# Patient Record
Sex: Male | Born: 1980 | Race: Black or African American | Hispanic: No | Marital: Single | Smoking: Current every day smoker
Health system: Southern US, Community
[De-identification: ages and names within clinical notes are randomized; demographics above are authoritative.]

## PROBLEM LIST (undated history)

## (undated) DIAGNOSIS — J189 Pneumonia, unspecified organism: Secondary | ICD-10-CM

---

## 2010-08-10 ENCOUNTER — Emergency Department (HOSPITAL_COMMUNITY)
Admission: EM | Admit: 2010-08-10 | Discharge: 2010-08-10 | Disposition: A | Discharge status: Home / Self Care | Payer: No Typology Code available for payment source | Attending: Emergency Medicine | Admitting: Emergency Medicine

## 2010-08-10 ENCOUNTER — Emergency Department (HOSPITAL_COMMUNITY): Payer: Self-pay

## 2010-08-10 DIAGNOSIS — S139XXA Sprain of joints and ligaments of unspecified parts of neck, initial encounter: Secondary | ICD-10-CM | POA: Insufficient documentation

## 2010-08-10 DIAGNOSIS — M542 Cervicalgia: Secondary | ICD-10-CM | POA: Insufficient documentation

## 2010-08-10 DIAGNOSIS — R51 Headache: Secondary | ICD-10-CM | POA: Insufficient documentation

## 2010-08-10 DIAGNOSIS — IMO0002 Reserved for concepts with insufficient information to code with codable children: Secondary | ICD-10-CM | POA: Insufficient documentation

## 2010-08-10 DIAGNOSIS — S0100XA Unspecified open wound of scalp, initial encounter: Secondary | ICD-10-CM | POA: Insufficient documentation

## 2010-08-10 DIAGNOSIS — E876 Hypokalemia: Secondary | ICD-10-CM | POA: Insufficient documentation

## 2010-08-10 DIAGNOSIS — M549 Dorsalgia, unspecified: Secondary | ICD-10-CM | POA: Insufficient documentation

## 2010-08-10 DIAGNOSIS — R071 Chest pain on breathing: Secondary | ICD-10-CM | POA: Insufficient documentation

## 2010-08-10 LAB — POCT I-STAT, CHEM 8
BUN: 8 mg/dL (ref 6–23)
Chloride: 103 mEq/L (ref 96–112)
Potassium: 3.3 mEq/L — ABNORMAL LOW (ref 3.5–5.1)
Sodium: 139 mEq/L (ref 135–145)

## 2010-08-10 LAB — ETHANOL: Alcohol, Ethyl (B): <11 mg/dL — ABNORMAL HIGH (ref 0–10)

## 2010-08-15 ENCOUNTER — Emergency Department (HOSPITAL_COMMUNITY): Payer: No Typology Code available for payment source

## 2010-08-15 ENCOUNTER — Emergency Department (HOSPITAL_COMMUNITY)
Admission: EM | Admit: 2010-08-15 | Discharge: 2010-08-15 | Disposition: A | Discharge status: Home / Self Care | Payer: No Typology Code available for payment source | Attending: Emergency Medicine | Admitting: Emergency Medicine

## 2010-08-15 DIAGNOSIS — Z4802 Encounter for removal of sutures: Secondary | ICD-10-CM | POA: Insufficient documentation

## 2010-08-15 DIAGNOSIS — M25519 Pain in unspecified shoulder: Secondary | ICD-10-CM | POA: Insufficient documentation

## 2010-09-18 ENCOUNTER — Emergency Department (HOSPITAL_COMMUNITY)
Admission: EM | Admit: 2010-09-18 | Discharge: 2010-09-18 | Disposition: A | Discharge status: Home / Self Care | Payer: Medicaid Other | Attending: Emergency Medicine | Admitting: Emergency Medicine

## 2010-09-18 DIAGNOSIS — S335XXA Sprain of ligaments of lumbar spine, initial encounter: Secondary | ICD-10-CM | POA: Insufficient documentation

## 2010-09-18 DIAGNOSIS — M545 Low back pain, unspecified: Secondary | ICD-10-CM | POA: Insufficient documentation

## 2010-09-18 DIAGNOSIS — X503XXA Overexertion from repetitive movements, initial encounter: Secondary | ICD-10-CM | POA: Insufficient documentation

## 2011-08-31 ENCOUNTER — Emergency Department (HOSPITAL_COMMUNITY)
Admission: EM | Admit: 2011-08-31 | Discharge: 2011-08-31 | Disposition: A | Discharge status: Home / Self Care | Payer: Self-pay | Attending: Emergency Medicine | Admitting: Emergency Medicine

## 2011-08-31 ENCOUNTER — Encounter (HOSPITAL_COMMUNITY): Payer: Self-pay | Admitting: *Deleted

## 2011-08-31 DIAGNOSIS — H00013 Hordeolum externum right eye, unspecified eyelid: Secondary | ICD-10-CM

## 2011-08-31 DIAGNOSIS — F17200 Nicotine dependence, unspecified, uncomplicated: Secondary | ICD-10-CM | POA: Insufficient documentation

## 2011-08-31 DIAGNOSIS — H5710 Ocular pain, unspecified eye: Secondary | ICD-10-CM | POA: Insufficient documentation

## 2011-08-31 HISTORY — DX: Pneumonia, unspecified organism: J18.9

## 2011-08-31 MED ORDER — ERYTHROMYCIN 5 MG/GM OP OINT: TOPICAL_OINTMENT | OPHTHALMIC | Status: DC

## 2011-08-31 NOTE — ED Provider Notes (Signed)
Medical screening examination/treatment/procedure(s) were performed by non-physician practitioner and as supervising physician I was immediately available for consultation/collaboration.   Eriverto Byrnes M Kayann Maj, MD 08/31/11 2349 

## 2011-08-31 NOTE — ED Provider Notes (Signed)
History     CSN: [id number]  Arrival date & time 08/31/11  0919   First MD Initiated Contact with Patient 08/31/11 [phone]      Chief Complaint  Patient presents with  . Eye Pain    right    (Consider location/radiation/quality/duration/timing/severity/associated sxs/prior treatment) HPI Patient presents emergency Dept. with pain and swelling to his right upper eyelid.  He states that he noticed it 2 days ago.  Patient denies any treatment for the swelling or pain.  Patient states that he does not have any visual changes, fever, headache, weakness, or nausea/vomiting.  Patient states a palpation makes the pain worse. Past Medical History  Diagnosis Date  . Pneumonia     History reviewed. No pertinent past surgical history.  History reviewed. No pertinent family history.  History  Substance Use Topics  . Smoking status: Current Everyday Smoker  . Smokeless tobacco: Not on file  . Alcohol Use: No      Review of Systems   All other systems negative except as documented in the HPI. All pertinent positives and negatives as reviewed in the HPI.  Allergies  Review of patient's allergies indicates no known allergies.  Home Medications  No current outpatient prescriptions on file.  BP 117/79  Pulse 73  Temp(Src) 98.2 F (36.8 C) (Oral)  Resp 18  SpO2 100%  Physical Exam  Nursing note and vitals reviewed. Constitutional: He appears well-developed and well-nourished.  Eyes:       Patient has a stye to his right upper lid and swelling noted.  There is no sign of periorbital infection at this time.  Pupils are PERRLA  Cardiovascular: Normal rate and regular rhythm.   Skin: Skin is warm and dry. No rash noted.    ED Course  Procedures (including critical care time)  Patiently for referred ophthalmology and given treatment for a right upper lid sty.  He is told to return here for any worsening in his condition  MDM          Carlyle Dolly, PA-C 08/31/11  1105

## 2011-08-31 NOTE — ED Notes (Signed)
Pt is here with right eyelid swelling and itching.  No vision change

## 2011-08-31 NOTE — Discharge Instructions (Signed)
Follow up with the eye doctor provided. REturn here as needed. Use as many warm compresses on the area as possible.

## 2012-02-17 ENCOUNTER — Emergency Department (HOSPITAL_COMMUNITY)
Admission: EM | Admit: 2012-02-17 | Discharge: 2012-02-17 | Disposition: A | Discharge status: Home / Self Care | Payer: Self-pay | Attending: Emergency Medicine | Admitting: Emergency Medicine

## 2012-02-17 ENCOUNTER — Encounter (HOSPITAL_COMMUNITY): Payer: Self-pay | Admitting: Physical Medicine and Rehabilitation

## 2012-02-17 DIAGNOSIS — Z792 Long term (current) use of antibiotics: Secondary | ICD-10-CM | POA: Insufficient documentation

## 2012-02-17 DIAGNOSIS — F17200 Nicotine dependence, unspecified, uncomplicated: Secondary | ICD-10-CM | POA: Insufficient documentation

## 2012-02-17 DIAGNOSIS — M549 Dorsalgia, unspecified: Secondary | ICD-10-CM | POA: Insufficient documentation

## 2012-02-17 DIAGNOSIS — J189 Pneumonia, unspecified organism: Secondary | ICD-10-CM | POA: Insufficient documentation

## 2012-02-17 MED ORDER — OXYCODONE-ACETAMINOPHEN 5-325 MG PO TABS: 2.0000 | ORAL_TABLET | ORAL | Status: DC | PRN

## 2012-02-17 MED ORDER — OXYCODONE-ACETAMINOPHEN 5-325 MG PO TABS
1.0000 | ORAL_TABLET | Freq: Once | ORAL | Status: AC
  Administered 2012-02-17: 1 via ORAL
  Filled 2012-02-17: qty 1

## 2012-02-17 NOTE — ED Notes (Signed)
Patient is alert and orientedx4.  Patient was explained discharge instructions and he had no questions.  A friend is coming to transport him home.
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
## 2012-02-17 NOTE — ED Notes (Signed)
Patient says he hurt himself at work yesterday and he could not come to the hospital until today because of transportation.  Patient says his back has been hurting him severely since yesterday. Finally today someone could give him a ride to the ED.

## 2012-02-24 ENCOUNTER — Emergency Department (HOSPITAL_COMMUNITY)
Admission: EM | Admit: 2012-02-24 | Discharge: 2012-02-24 | Disposition: A | Discharge status: Home / Self Care | Payer: Self-pay | Attending: Emergency Medicine | Admitting: Emergency Medicine

## 2012-02-24 DIAGNOSIS — Z09 Encounter for follow-up examination after completed treatment for conditions other than malignant neoplasm: Secondary | ICD-10-CM | POA: Insufficient documentation

## 2012-02-24 DIAGNOSIS — F17200 Nicotine dependence, unspecified, uncomplicated: Secondary | ICD-10-CM | POA: Insufficient documentation

## 2012-02-24 DIAGNOSIS — M549 Dorsalgia, unspecified: Secondary | ICD-10-CM

## 2012-02-24 NOTE — ED Provider Notes (Signed)
History     CSN: [id number]  Arrival date & time 02/24/12  1313   First MD Initiated Contact with Patient 02/24/12 1401      Chief Complaint  Patient presents with  . Follow-up   Chief complaint: Needs work note (Consider location/radiation/quality/duration/timing/severity/associated sxs/prior treatment) HPI Patient seen here 02/17/2012 for low back pain needs a note allowing him to go back to work. He is asymptomatic today. No other complaint. Treated with Percocet. Presently not requiring any medications. Past Medical History  Diagnosis Date  . Pneumonia     No past surgical history on file.  No family history on file.  History  Substance Use Topics  . Smoking status: Current Every Day Smoker    Types: Cigarettes  . Smokeless tobacco: Not on file  . Alcohol Use: No      Review of Systems  Constitutional: Negative.   HENT: Negative.   Respiratory: Negative.   Cardiovascular: Negative.   Gastrointestinal: Negative.   Musculoskeletal: Negative.   Skin: Negative.   Neurological: Negative.   Hematological: Negative.   Psychiatric/Behavioral: Negative.   All other systems reviewed and are negative.    Allergies  Review of patient's allergies indicates no known allergies.  Home Medications  No current outpatient prescriptions on file.  BP 118/64  Pulse 82  Temp 97.9 F (36.6 C) (Oral)  Resp 16  SpO2 97%  Physical Exam  Nursing note and vitals reviewed. Constitutional: He is oriented to person, place, and time. He appears well-developed and well-nourished.  HENT:  Head: Normocephalic and atraumatic.  Eyes: Conjunctivae normal are normal. Pupils are equal, round, and reactive to light.  Neck: Neck supple. No tracheal deviation present. No thyromegaly present.  Cardiovascular: Normal rate and regular rhythm.   No murmur heard. Pulmonary/Chest: Effort normal and breath sounds normal.  Abdominal: Soft. Bowel sounds are normal. He exhibits no  distension. There is no tenderness.  Musculoskeletal: Normal range of motion. He exhibits no edema and no tenderness.       Entire spine nontender  Neurological: He is alert and oriented to person, place, and time. Coordination normal.       Gait normal motor strength 5 over 5 overall  Skin: Skin is warm and dry. No rash noted.  Psychiatric: He has a normal mood and affect.    ED Course  Procedures (including critical care time)  Labs Reviewed - No data to display No results found.   No diagnosis found.    MDM  Patient cleared to return to work Diagnosis back Marisue Ivan, MD 02/24/12 1411

## 2012-02-24 NOTE — ED Notes (Signed)
Returned to ED for clearance to return to work after a back injury. States he needs a note that says he can return without restrictions

## 2012-02-24 NOTE — ED Notes (Signed)
Patient was seen here in the ER last week for back pain. He states he feels better and is unable to return to work with out a follow up and paperwork lifting his restrictions. He states he is pain free at this time.

## 2012-03-19 ENCOUNTER — Encounter (HOSPITAL_COMMUNITY): Payer: Self-pay | Admitting: Family Medicine

## 2012-03-19 ENCOUNTER — Emergency Department (HOSPITAL_COMMUNITY)
Admission: EM | Admit: 2012-03-19 | Discharge: 2012-03-19 | Disposition: A | Discharge status: Home / Self Care | Payer: Self-pay | Attending: Emergency Medicine | Admitting: Emergency Medicine

## 2012-03-19 DIAGNOSIS — R1110 Vomiting, unspecified: Secondary | ICD-10-CM | POA: Insufficient documentation

## 2012-03-19 DIAGNOSIS — F17200 Nicotine dependence, unspecified, uncomplicated: Secondary | ICD-10-CM | POA: Insufficient documentation

## 2012-03-19 DIAGNOSIS — Z8701 Personal history of pneumonia (recurrent): Secondary | ICD-10-CM | POA: Insufficient documentation

## 2012-03-19 LAB — COMPREHENSIVE METABOLIC PANEL
AST: 20 U/L (ref 0–37)
Alkaline Phosphatase: 60 U/L (ref 39–117)
BUN: 9 mg/dL (ref 6–23)
CO2: 26 mEq/L (ref 19–32)
Chloride: 103 mEq/L (ref 96–112)
Creatinine, Ser: 0.87 mg/dL (ref 0.50–1.35)
GFR calc non Af Amer: >90 mL/min (ref >90)
Potassium: 4.4 mEq/L (ref 3.5–5.1)
Total Bilirubin: 0.4 mg/dL (ref 0.3–1.2)

## 2012-03-19 LAB — CBC WITH DIFFERENTIAL/PLATELET
Basophils Absolute: 0.1 10*3/uL (ref 0.0–0.1)
HCT: 42.8 % (ref 39.0–52.0)
Hemoglobin: 15.3 g/dL (ref 13.0–17.0)
Lymphocytes Relative: 34 % (ref 12–46)
Monocytes Absolute: 0.3 10*3/uL (ref 0.1–1.0)
Monocytes Relative: 5 % (ref 3–12)
Neutro Abs: 2.6 10*3/uL (ref 1.7–7.7)
RBC: 4.86 MIL/uL (ref 4.22–5.81)
WBC: 5 10*3/uL (ref 4.0–10.5)

## 2012-03-19 LAB — URINALYSIS, ROUTINE W REFLEX MICROSCOPIC
Bilirubin Urine: NEGATIVE
Glucose, UA: NEGATIVE mg/dL
Ketones, ur: NEGATIVE mg/dL
Leukocytes, UA: NEGATIVE
Protein, ur: NEGATIVE mg/dL

## 2012-03-19 LAB — URINE MICROSCOPIC-ADD ON

## 2012-03-19 MED ORDER — PROMETHAZINE HCL 25 MG PO TABS: 25.0000 mg | ORAL_TABLET | Freq: Four times a day (QID) | ORAL | Status: DC | PRN

## 2012-03-19 NOTE — ED Provider Notes (Signed)
History   This chart was scribed for Marcus Shi, MD, by Frederik Pear, ER scribe. The patient was seen in room TR05C/TR05C and the patient's care was started at 1120.    CSN: [id number]  Arrival date & time 03/19/12  4098   First MD Initiated Contact with Patient 03/19/12 1120      Chief Complaint  Patient presents with  . Emesis    (Consider location/radiation/quality/duration/timing/severity/associated sxs/prior treatment) HPI Comments: Marcus Dennis is a 31 y.o. male who presents to the Emergency Department complaining of intermittent emesis 3x that began at 0500 and awoke him from sleep. He denies any abdominal pain or diarrhea. In ED, his temperature is 99 and he denies nausea. He has no chronic medication conditions and takes no daily medications. He reports that he has from [place], but has not traveled out of the country recently. He states that he has not consumed ETOH within the past week.    Past Medical History  Diagnosis Date  . Pneumonia     History reviewed. No pertinent past surgical history.  History reviewed. No pertinent family history.  History  Substance Use Topics  . Smoking status: Current Every Day Smoker    Types: Cigarettes  . Smokeless tobacco: Not on file  . Alcohol Use: No      Review of Systems A complete 10 system review of systems was obtained and all systems are negative except as noted in the HPI and PMH.   Allergies  Review of patient's allergies indicates no known allergies.  Home Medications   Current Outpatient Rx  Name  Route  Sig  Dispense  Refill  . PROMETHAZINE HCL 25 MG PO TABS   Oral   Take 1 tablet (25 mg total) by mouth every 6 (six) hours as needed for nausea.   30 tablet   0     BP 122/73  Pulse 80  Temp 99 F (37.2 C) (Oral)  Resp 16  SpO2 97%  Physical Exam  Nursing note and vitals reviewed. Constitutional: He is oriented to person, place, and time. He appears well-developed and well-nourished.  No distress.  HENT:  Head: Normocephalic and atraumatic.  Eyes: Pupils are equal, round, and reactive to light.  Neck: Normal range of motion.  Cardiovascular: Normal rate and intact distal pulses.   Pulmonary/Chest: No respiratory distress.  Abdominal: Normal appearance. He exhibits no distension. There is no tenderness. There is no rebound and no guarding.  Musculoskeletal: Normal range of motion.  Neurological: He is alert and oriented to person, place, and time. No cranial nerve deficit.  Skin: Skin is warm and dry. No rash noted.  Psychiatric: He has a normal mood and affect. His behavior is normal.    ED Course  Procedures (including critical care time)  DIAGNOSTIC STUDIES: Oxygen Saturation is 97% on room air, adequate by my interpretation.    COORDINATION OF CARE:  11:49- Discussed planned course of treatment with the patient, including blood work, who is agreeable at this time.   Results for orders placed during the hospital encounter of 03/19/12  COMPREHENSIVE METABOLIC PANEL      Component Value Range   Sodium 139  135 - 145 mEq/L   Potassium 4.4  3.5 - 5.1 mEq/L   Chloride 103  96 - 112 mEq/L   CO2 26  19 - 32 mEq/L   Glucose, Bld 78  70 - 99 mg/dL   BUN 9  6 - 23 mg/dL   Creatinine, Ser  0.87  0.50 - 1.35 mg/dL   Calcium 9.5  8.4 - 16.1 mg/dL   Total Protein 7.8  6.0 - 8.3 g/dL   Albumin 3.8  3.5 - 5.2 g/dL   AST 20  0 - 37 U/L   ALT 12  0 - 53 U/L   Alkaline Phosphatase 60  39 - 117 U/L   Total Bilirubin 0.4  0.3 - 1.2 mg/dL   GFR calc non Af Amer >90  >90 mL/min   GFR calc Af Amer >90  >90 mL/min  LIPASE, BLOOD      Component Value Range   Lipase 15  11 - 59 U/L  URINALYSIS, ROUTINE W REFLEX MICROSCOPIC      Component Value Range   Color, Urine YELLOW  YELLOW   APPearance CLEAR  CLEAR   Specific Gravity, Urine 1.021  1.005 - 1.030   pH 5.0  5.0 - 8.0   Glucose, UA NEGATIVE  NEGATIVE mg/dL   Hgb urine dipstick TRACE (*) NEGATIVE   Bilirubin Urine  NEGATIVE  NEGATIVE   Ketones, ur NEGATIVE  NEGATIVE mg/dL   Protein, ur NEGATIVE  NEGATIVE mg/dL   Urobilinogen, UA 0.2  0.0 - 1.0 mg/dL   Nitrite NEGATIVE  NEGATIVE   Leukocytes, UA NEGATIVE  NEGATIVE  URINE MICROSCOPIC-ADD ON      Component Value Range   Squamous Epithelial / LPF RARE  RARE   WBC, UA 0-2  <3 WBC/hpf   RBC / HPF 0-2  <3 RBC/hpf     Labs Reviewed  URINALYSIS, ROUTINE W REFLEX MICROSCOPIC - Abnormal; Notable for the following:    Hgb urine dipstick TRACE (*)     All other components within normal limits  COMPREHENSIVE METABOLIC PANEL  LIPASE, BLOOD  URINE MICROSCOPIC-ADD ON  CBC WITH DIFFERENTIAL   No results found.   1. Vomiting       MDM  I personally performed the services described in this documentation, which was scribed in my presence. The recorded information has been reviewed and considered.      Marcus Shi, MD 03/19/12 [phone]
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
## 2013-12-26 ENCOUNTER — Emergency Department (HOSPITAL_COMMUNITY)
Admission: EM | Admit: 2013-12-26 | Discharge: 2013-12-26 | Disposition: A | Discharge status: Home / Self Care | Payer: Medicaid Other | Attending: Emergency Medicine | Admitting: Emergency Medicine

## 2013-12-26 ENCOUNTER — Emergency Department (HOSPITAL_COMMUNITY): Payer: Medicaid Other

## 2013-12-26 ENCOUNTER — Encounter (HOSPITAL_COMMUNITY): Payer: Self-pay | Admitting: Emergency Medicine

## 2013-12-26 DIAGNOSIS — Z8701 Personal history of pneumonia (recurrent): Secondary | ICD-10-CM | POA: Insufficient documentation

## 2013-12-26 DIAGNOSIS — F17200 Nicotine dependence, unspecified, uncomplicated: Secondary | ICD-10-CM | POA: Insufficient documentation

## 2013-12-26 DIAGNOSIS — R059 Cough, unspecified: Secondary | ICD-10-CM | POA: Insufficient documentation

## 2013-12-26 DIAGNOSIS — M546 Pain in thoracic spine: Secondary | ICD-10-CM | POA: Insufficient documentation

## 2013-12-26 DIAGNOSIS — R05 Cough: Secondary | ICD-10-CM | POA: Insufficient documentation

## 2013-12-26 DIAGNOSIS — J9801 Acute bronchospasm: Secondary | ICD-10-CM

## 2013-12-26 DIAGNOSIS — J069 Acute upper respiratory infection, unspecified: Secondary | ICD-10-CM | POA: Insufficient documentation

## 2013-12-26 DIAGNOSIS — B9789 Other viral agents as the cause of diseases classified elsewhere: Secondary | ICD-10-CM

## 2013-12-26 MED ORDER — ALBUTEROL SULFATE (2.5 MG/3ML) 0.083% IN NEBU
5.0000 mg | INHALATION_SOLUTION | Freq: Once | RESPIRATORY_TRACT | Status: AC
  Administered 2013-12-26: 5 mg via RESPIRATORY_TRACT
  Filled 2013-12-26: qty 6

## 2013-12-26 MED ORDER — ALBUTEROL SULFATE HFA 108 (90 BASE) MCG/ACT IN AERS
2.0000 | INHALATION_SPRAY | RESPIRATORY_TRACT | Status: DC | PRN
  Administered 2013-12-26: 2 via RESPIRATORY_TRACT
  Filled 2013-12-26: qty 6.7

## 2013-12-26 MED ORDER — FLUTICASONE PROPIONATE 50 MCG/ACT NA SUSP: 2.0000 | Freq: Every day | NASAL | Status: DC

## 2013-12-26 MED ORDER — AEROCHAMBER PLUS W/MASK MISC
1.0000 | Freq: Once | Status: AC
  Administered 2013-12-26: 1
  Filled 2013-12-26: qty 1

## 2013-12-26 MED ORDER — BENZONATATE 100 MG PO CAPS
200.0000 mg | ORAL_CAPSULE | Freq: Once | ORAL | Status: AC
  Administered 2013-12-26: 200 mg via ORAL
  Filled 2013-12-26: qty 2

## 2013-12-26 MED ORDER — BENZONATATE 100 MG PO CAPS: 100.0000 mg | ORAL_CAPSULE | Freq: Three times a day (TID) | ORAL | Status: DC

## 2013-12-26 NOTE — Discharge Instructions (Signed)
1. Medications: Flonase, mucinex - over-the-counter, storebrand, tessalon, usual home medications 2. Treatment: rest, drink plenty of fluids, take tylenol or ibuprofen for fever control and upper back pain 3. Follow Up: Please followup with your primary doctor in 3 days for discussion of your diagnoses and further evaluation after today's visit; if you do not have a primary care doctor use the resource guide provided to find one;    Upper Respiratory Infection, Adult An upper respiratory infection (URI) is also known as the common cold. It is often caused by a type of germ (virus). Colds are easily spread (contagious). You can pass it to others by kissing, coughing, sneezing, or drinking out of the same glass. Usually, you get better in 1 or 2 weeks.  HOME CARE   Only take medicine as told by your doctor.  Use a warm mist humidifier or breathe in steam from a hot shower.  Drink enough water and fluids to keep your pee (urine) clear or pale yellow.  Get plenty of rest.  Return to work when your temperature is back to normal or as told by your doctor. You may use a face mask and wash your hands to stop your cold from spreading. GET HELP RIGHT AWAY IF:   After the first few days, you feel you are getting worse.  You have questions about your medicine.  You have chills, shortness of breath, or brown or red spit (mucus).  You have yellow or brown snot (nasal discharge) or pain in the face, especially when you bend forward.  You have a fever, puffy (swollen) neck, pain when you swallow, or white spots in the back of your throat.  You have a bad headache, ear pain, sinus pain, or chest pain.  You have a high-pitched whistling sound when you breathe in and out (wheezing).  You have a lasting cough or cough up blood.  You have sore muscles or a stiff neck. MAKE SURE YOU:   Understand these instructions.  Will watch your condition.  Will get help right away if you are not doing well  or get worse. Document Released: 09/02/2007 Document Revised: 06/08/2011 Document Reviewed: 06/21/2013 [facility] Patient Information 2015 [employer], [place]. This information is not intended to replace advice given to you by your health care provider. Make sure you discuss any questions you have with your health care provider.

## 2013-12-26 NOTE — ED Provider Notes (Signed)
CSN: [id number]     Arrival date & time 12/26/13  1645 History  This chart was scribed for non-physician practitioner, .Dierdre Forth, PA-C working with Purvis Sheffield, MD by Luisa Dago, ED scribe. This patient was seen in room TR07C/TR07C and the patient's care was started at 6:53 PM.     Chief Complaint  Patient presents with  . Cough  . Back Pain   The history is provided by the patient and medical records. No language interpreter was used.   HPI Comments: Finian Helvey is a 33 y.o. male who presents to the Emergency Department complaining of a persistent cough that started 3 days ago. Pt is also complaining of associated rhinorrhea, post nasal drip, nasal congestion, 1 episode of emesis this morning, and low grade fever. Current ED temperature is 98.4. Mr. Poffenberger is also endorses associated upper paraspinal back pain, which he only feels when he coughs. He denies taking any OTC medication. Pt denies taking any daily medication, ear pain, or abdominal pain.  No alleviating factors.  Past Medical History  Diagnosis Date  . Pneumonia    History reviewed. No pertinent past surgical history. No family history on file. History  Substance Use Topics  . Smoking status: Current Every Day Smoker -- 1.00 packs/day    Types: Cigarettes  . Smokeless tobacco: Not on file  . Alcohol Use: No    Review of Systems  Constitutional: Positive for chills. Negative for fever.  HENT: Positive for congestion, rhinorrhea and sore throat. Negative for ear pain, sinus pressure, trouble swallowing and voice change.   Eyes: Negative for discharge.  Respiratory: Positive for cough. Negative for shortness of breath, wheezing and stridor.   Cardiovascular: Negative for chest pain and leg swelling.  Gastrointestinal: Negative for abdominal pain, constipation and abdominal distention.  Genitourinary: Negative.  Negative for dysuria, urgency, frequency, flank pain and difficulty urinating.   Musculoskeletal: Positive for back pain. Negative for gait problem and joint swelling.  Skin: Negative for rash.  Neurological: Negative for weakness and numbness.   Allergies  Review of patient's allergies indicates no known allergies.  Home Medications   Prior to Admission medications   Medication Sig Start Date End Date Taking? Authorizing Provider  benzonatate (TESSALON) 100 MG capsule Take 1 capsule (100 mg total) by mouth every 8 (eight) hours. 12/26/13   Patience Nuzzo, PA-C  fluticasone (FLONASE) 50 MCG/ACT nasal spray Place 2 sprays into both nostrils daily. 12/26/13   Cielo Arias, PA-C   Triage vitals:BP 121/84  Pulse 72  Temp(Src) 98.4 F (36.9 C) (Oral)  Resp 18  SpO2 100%  Physical Exam  Nursing note and vitals reviewed. Constitutional: He is oriented to person, place, and time. He appears well-developed and well-nourished. No distress.  HENT:  Head: Normocephalic and atraumatic.  Right Ear: Tympanic membrane, external ear and ear canal normal.  Left Ear: Tympanic membrane, external ear and ear canal normal.  Nose: Mucosal edema and rhinorrhea present. No epistaxis. Right sinus exhibits no maxillary sinus tenderness and no frontal sinus tenderness. Left sinus exhibits no maxillary sinus tenderness and no frontal sinus tenderness.  Mouth/Throat: Uvula is midline, oropharynx is clear and moist and mucous membranes are normal. Mucous membranes are not pale and not cyanotic. No oropharyngeal exudate, posterior oropharyngeal edema, posterior oropharyngeal erythema or tonsillar abscesses.  Eyes: Conjunctivae are normal. Pupils are equal, round, and reactive to light.  Neck: Normal range of motion and full passive range of motion without pain. Neck supple.  Full ROM without  pain  Cardiovascular: Normal rate, regular rhythm and intact distal pulses.   Pulmonary/Chest: Effort normal and breath sounds normal. No stridor. No respiratory distress. He has no wheezes.   Clear and equal breath sounds without focal wheezes, rhonchi, rales  Abdominal: Soft. Bowel sounds are normal. He exhibits no distension. There is no tenderness.  Musculoskeletal: Normal range of motion.  Full range of motion of the T-spine and L-spine No tenderness to palpation of the spinous processes of the T-spine or L-spine Mild tenderness to palpation of the paraspinous muscles of the upper T-spine  Lymphadenopathy:    He has no cervical adenopathy.  Neurological: He is alert and oriented to person, place, and time. He has normal reflexes.  Speech is clear and goal oriented, follows commands Normal 5/5 strength in upper and lower extremities bilaterally including dorsiflexion and plantar flexion, strong and equal grip strength Sensation normal to light and sharp touch Moves extremities without ataxia, coordination intact Normal gait Normal balance   Skin: Skin is warm and dry. No rash noted. He is not diaphoretic. No erythema.  Psychiatric: He has a normal mood and affect. His behavior is normal.    ED Course  Procedures (including critical care time)  DIAGNOSTIC STUDIES: Oxygen Saturation is 100% on RA, normal by my interpretation.    COORDINATION OF CARE: 6:57 PM- Will order a CXR. Pt is also requesting a one day note for work. Pt advised of plan for treatment and pt agrees.  Medications  albuterol (PROVENTIL HFA;VENTOLIN HFA) 108 (90 BASE) MCG/ACT inhaler 2 puff (not administered)  aerochamber plus with mask device 1 each (not administered)  albuterol (PROVENTIL) (2.5 MG/3ML) 0.083% nebulizer solution 5 mg (5 mg Nebulization Given 12/26/13 1903)  benzonatate (TESSALON) capsule 200 mg (200 mg Oral Given 12/26/13 1906)   Imaging Review Dg Chest 2 View  12/26/2013   CLINICAL DATA:  Cough.  EXAM: CHEST  2 VIEW  COMPARISON:  Aug 10, 2010.  FINDINGS: The heart size and mediastinal contours are within normal limits. Both lungs are clear. No pneumothorax or pleural effusion is  noted. The visualized skeletal structures are unremarkable.  IMPRESSION: No acute cardiopulmonary abnormality seen.   Electronically Signed   By: Roque LiasJames  Green M.D.   On: 12/26/2013 18:17     MDM   Final diagnoses:  Viral URI with cough  Bronchospasm, acute   Bronc Laden presents with URI symptoms and c/o upper back pain with coughing.  No neurological deficits and normal neuro exam.  Patient can walk without difficulty.  No red flags for back pain and pain only with coughing consistent with strained muscle.  Patient with mild wheezing on exam, will give albuterol. Patient is in everyday smoker  8:00 PM Pt CXR negative for acute infiltrate. Patients symptoms are consistent with URI, likely viral etiology. Discussed that antibiotics are not indicated for viral infections. Patient with clear and equal breath sounds after albuterol treatment and reports he is breathing better.   Patient ambulated in ED with O2 saturations maintained >90, no current signs of respiratory distress. Lung exam improved after nebulizer treatment. Patient will be discharged home with albuterol inhaler for further usage as needed in addition to symptomatic treatment.  Verbalizes understanding and is agreeable with plan. Pt is hemodynamically stable & in NAD prior to dc.  BP 125/84  Pulse 90  Temp(Src) 98.4 F (36.9 C) (Oral)  Resp 16  SpO2 97%   I personally performed the services described in this documentation, which was scribed  in my presence. The recorded information has been reviewed and is accurate.    Dahlia Client Breyana Follansbee, PA-C 12/26/13 2001

## 2013-12-26 NOTE — ED Provider Notes (Signed)
Medical screening examination/treatment/procedure(s) were performed by non-physician practitioner and as supervising physician I was immediately available for consultation/collaboration.   EKG Interpretation None        Purvis SheffieldForrest Christion Leonhard, MD 12/26/13 2350
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
## 2014-01-08 ENCOUNTER — Encounter (HOSPITAL_COMMUNITY): Payer: Self-pay | Admitting: Emergency Medicine

## 2014-01-08 ENCOUNTER — Emergency Department (HOSPITAL_COMMUNITY): Payer: No Typology Code available for payment source

## 2014-01-08 ENCOUNTER — Emergency Department (HOSPITAL_COMMUNITY)
Admission: EM | Admit: 2014-01-08 | Discharge: 2014-01-08 | Disposition: A | Discharge status: Home / Self Care | Payer: No Typology Code available for payment source | Attending: Emergency Medicine | Admitting: Emergency Medicine

## 2014-01-08 DIAGNOSIS — Z8701 Personal history of pneumonia (recurrent): Secondary | ICD-10-CM | POA: Insufficient documentation

## 2014-01-08 DIAGNOSIS — M79644 Pain in right finger(s): Secondary | ICD-10-CM

## 2014-01-08 DIAGNOSIS — Z720 Tobacco use: Secondary | ICD-10-CM | POA: Insufficient documentation

## 2014-01-08 DIAGNOSIS — M79641 Pain in right hand: Secondary | ICD-10-CM | POA: Insufficient documentation

## 2014-01-08 MED ORDER — HYDROCODONE-ACETAMINOPHEN 5-325 MG PO TABS: 1.0000 | ORAL_TABLET | ORAL | Status: DC | PRN

## 2014-01-08 MED ORDER — SULFAMETHOXAZOLE-TRIMETHOPRIM 800-160 MG PO TABS: 1.0000 | ORAL_TABLET | Freq: Two times a day (BID) | ORAL | Status: DC

## 2014-01-08 NOTE — Discharge Instructions (Signed)
WEAR THE FINGER SPLINT. RETURN IN 2 DAYS FOR RECHECK OF RIGHT HAND PAIN. TAKE MEDICATIONS AS PRESCRIBED. RETURN HERE SOONER WITH ANY HIGH FEVER, REDNESS, INCREASED PAIN.

## 2014-01-08 NOTE — ED Notes (Signed)
Declined W/C at D/C and was escorted to lobby by RN. 

## 2014-01-08 NOTE — ED Provider Notes (Signed)
Medical screening examination/treatment/procedure(s) were performed by non-physician practitioner and as supervising physician I was immediately available for consultation/collaboration.   EKG Interpretation None        Purvis SheffieldForrest Mark Benecke, MD 01/08/14 2001

## 2014-01-08 NOTE — ED Notes (Signed)
Pt c/o right fifth digit pain and swelling x 4 days; pt denies obvious injury

## 2014-01-08 NOTE — ED Provider Notes (Signed)
CSN: [id number]     Arrival date & time 01/08/14  1331 History  This chart was scribed for non-physician practitioner Elpidio AnisShari Lavelle Akel, PA-C working with Purvis SheffieldForrest Harrison, MD by Conchita ParisNadim Abuhashem, ED Scribe. This patient was seen in TR04C/TR04C and the patient's care was started at 3:29 PM.     Chief Complaint  Patient presents with  . Finger Injury   Patient is a 33 y.o. male presenting with hand pain. The history is provided by the patient. No language interpreter was used.  Hand Pain This is a new problem. The current episode started in the past 7 days. The problem occurs constantly. The problem has been unchanged. Associated symptoms include joint swelling. Pertinent negatives include no chest pain, chills, fever, myalgias, nausea or numbness. Associated symptoms comments: Pain and swelling over 5th MCP without redness, known injury or finger involvement. Pain centers on dorsal aspect. No extension of pain into wrist or arm. No fever. He denies history of diabetes or other immunocompromised state.Marland Kitchen.   HPI Comments: Marcus Dennis is a 33 y.o. male who presents to the Emergency Department complaining of right fifth digit pain and swelling. He says the pain started 4 days ago and the swelling began two days ago. He notes the pain is sharp, sudden onset rated a 10/10. Pt took aspirin and tylenol, he also put a hot rag on his finger but notes no relief to the pain. Pt said it hurts when he moves his finger. Pt notes he does not know what caused the pain. He had an upper respiratory infection last week that has resolved. He denies fever, CP, SOB.    Past Medical History  Diagnosis Date  . Pneumonia    History reviewed. No pertinent past surgical history. History reviewed. No pertinent family history. History  Substance Use Topics  . Smoking status: Current Every Day Smoker -- 1.00 packs/day    Types: Cigarettes  . Smokeless tobacco: Not on file  . Alcohol Use: No    Review of Systems   Constitutional: Negative for fever and chills.  Respiratory: Negative.  Negative for shortness of breath.   Cardiovascular: Negative.  Negative for chest pain.  Gastrointestinal: Negative.  Negative for nausea.  Musculoskeletal: Positive for joint swelling. Negative for myalgias.       See HPI  Skin: Negative.  Negative for color change and wound.  Neurological: Negative.  Negative for numbness.      Allergies  Review of patient's allergies indicates no known allergies.  Home Medications   Prior to Admission medications   Not on File   BP 108/75  Pulse 64  Temp(Src) 98.4 F (36.9 C) (Oral)  Resp 16  SpO2 97% Physical Exam  Nursing note and vitals reviewed. Constitutional: He is oriented to person, place, and time. He appears well-developed and well-nourished. No distress.  HENT:  Head: Normocephalic and atraumatic.  Eyes: EOM are normal.  Neck: Normal range of motion.  Cardiovascular: Normal rate.   Pulses:      Radial pulses are 2+ on the right side, and 2+ on the left side.  Pulmonary/Chest: Effort normal.  Musculoskeletal: He exhibits tenderness. He exhibits no edema.  TTP limited to right 5th MCP dorsum. No flexor tenderness of this joint or of finger. FROM all joints of finger with limited ROM MCP joint secondary to pain. . No warmth, erythema, crepitus.    Neurological: He is alert and oriented to person, place, and time.  Skin: Skin is warm and dry.  Psychiatric:  He has a normal mood and affect. His behavior is normal.    ED Course  Procedures  DIAGNOSTIC STUDIES: Oxygen Saturation is 97% on room air, adequate by my interpretation.    COORDINATION OF CARE: 3:39 PM Discussed treatment plan with pt at bedside and pt agreed to plan.   Labs Review Labs Reviewed - No data to display  Imaging Review Dg Hand Complete Right  01/08/2014   CLINICAL DATA:  [age] year old male who awoke with pain and swelling at the right fifth MCP joint. No known injury.  Initial encounter.  EXAM: RIGHT HAND - COMPLETE 3+ VIEW  COMPARISON:  None.  FINDINGS: Bone mineralization is within normal limits. Fifth MCP and other joint spaces in the right hand are preserved and within normal limits. No acute fracture or dislocation. No periosteal reaction. No subcutaneous gas. No radiopaque foreign body identified.  IMPRESSION: No acute osseous abnormality identified.  In the right hand.   Electronically Signed   By: Augusto GambleLee  Hall M.D.   On: 01/08/2014 14:51     EKG Interpretation None      MDM   Final diagnoses:  None  I personally performed the services described in this documentation, which was scribed in my presence. The recorded information has been reviewed and is accurate.     1. Right carpal joint pain  Minimal concern for infection given no wound, redness, streaking or fever. No flexor tenderness. Finger is splinted and he is started on abx, given pain management. Strongly encouraged return in 2 days for recheck which patient agreed to do.     Arnoldo HookerShari A Samuel Rittenhouse, PA-C 01/08/14 1655

## 2014-10-25 ENCOUNTER — Emergency Department (HOSPITAL_COMMUNITY)
Admission: EM | Admit: 2014-10-25 | Discharge: 2014-10-25 | Disposition: A | Discharge status: Home / Self Care | Payer: Medicaid Other | Attending: Emergency Medicine | Admitting: Emergency Medicine

## 2014-10-25 ENCOUNTER — Encounter (HOSPITAL_COMMUNITY): Payer: Self-pay | Admitting: Vascular Surgery

## 2014-10-25 DIAGNOSIS — Y929 Unspecified place or not applicable: Secondary | ICD-10-CM | POA: Insufficient documentation

## 2014-10-25 DIAGNOSIS — Z720 Tobacco use: Secondary | ICD-10-CM | POA: Insufficient documentation

## 2014-10-25 DIAGNOSIS — Y990 Civilian activity done for income or pay: Secondary | ICD-10-CM | POA: Insufficient documentation

## 2014-10-25 DIAGNOSIS — X58XXXA Exposure to other specified factors, initial encounter: Secondary | ICD-10-CM | POA: Insufficient documentation

## 2014-10-25 DIAGNOSIS — Z8701 Personal history of pneumonia (recurrent): Secondary | ICD-10-CM | POA: Insufficient documentation

## 2014-10-25 DIAGNOSIS — M5442 Lumbago with sciatica, left side: Secondary | ICD-10-CM

## 2014-10-25 DIAGNOSIS — Y9389 Activity, other specified: Secondary | ICD-10-CM | POA: Insufficient documentation

## 2014-10-25 DIAGNOSIS — S39012A Strain of muscle, fascia and tendon of lower back, initial encounter: Secondary | ICD-10-CM | POA: Insufficient documentation

## 2014-10-25 MED ORDER — METHOCARBAMOL 1000 MG/10ML IJ SOLN
1000.0000 mg | Freq: Once | INTRAMUSCULAR | Status: DC
  Filled 2014-10-25: qty 10

## 2014-10-25 MED ORDER — NAPROXEN 500 MG PO TABS: 500.0000 mg | ORAL_TABLET | Freq: Two times a day (BID) | ORAL | Status: DC

## 2014-10-25 MED ORDER — PREDNISONE 20 MG PO TABS
60.0000 mg | ORAL_TABLET | Freq: Once | ORAL | Status: AC
  Administered 2014-10-25: 60 mg via ORAL
  Filled 2014-10-25: qty 3

## 2014-10-25 MED ORDER — PREDNISONE 20 MG PO TABS: 40.0000 mg | ORAL_TABLET | Freq: Every day | ORAL | Status: DC

## 2014-10-25 MED ORDER — METHOCARBAMOL 500 MG PO TABS
500.0000 mg | ORAL_TABLET | Freq: Once | ORAL | Status: AC
  Administered 2014-10-25: 500 mg via ORAL
  Filled 2014-10-25: qty 1

## 2014-10-25 MED ORDER — METHOCARBAMOL 500 MG PO TABS: 500.0000 mg | ORAL_TABLET | Freq: Two times a day (BID) | ORAL | Status: DC

## 2014-10-25 MED ORDER — HYDROMORPHONE HCL 1 MG/ML IJ SOLN
1.0000 mg | Freq: Once | INTRAMUSCULAR | Status: AC
  Administered 2014-10-25: 1 mg via INTRAMUSCULAR
  Filled 2014-10-25: qty 1

## 2014-10-25 MED ORDER — HYDROCODONE-ACETAMINOPHEN 5-325 MG PO TABS: 1.0000 | ORAL_TABLET | Freq: Four times a day (QID) | ORAL | Status: DC | PRN

## 2014-10-25 NOTE — ED Notes (Signed)
Patient is alert and orientedx4.  Patient was explained discharge instructions and they understood them with no questions.  The patient's wife, Marianna Fuss is taking the patient home.

## 2014-10-25 NOTE — Discharge Instructions (Signed)
1. Medications: robaxin, naproxyn, vicodin, prednisone, usual home medications 2. Treatment: rest, drink plenty of fluids, gentle stretching as discussed, alternate ice and heat 3. Follow Up: Please followup with your primary doctor in 3 days for discussion of your diagnoses and further evaluation after today's visit; if you do not have a primary care doctor use the resource guide provided to find one;  Return to the ER for worsening back pain, difficulty walking, loss of bowel or bladder control or other concerning symptoms    Back Exercises Back exercises help treat and prevent back injuries. The goal of back exercises is to increase the strength of your abdominal and back muscles and the flexibility of your back. These exercises should be started when you no longer have back pain. Back exercises include:  Pelvic Tilt. Lie on your back with your knees bent. Tilt your pelvis until the lower part of your back is against the floor. Hold this position 5 to 10 sec and repeat 5 to 10 times.  Knee to Chest. Pull first 1 knee up against your chest and hold for 20 to 30 seconds, repeat this with the other knee, and then both knees. This may be done with the other leg straight or bent, whichever feels better.  Sit-Ups or Curl-Ups. Bend your knees 90 degrees. Start with tilting your pelvis, and do a partial, slow sit-up, lifting your trunk only 30 to 45 degrees off the floor. Take at least 2 to 3 seconds for each sit-up. Do not do sit-ups with your knees out straight. If partial sit-ups are difficult, simply do the above but with only tightening your abdominal muscles and holding it as directed.  Hip-Lift. Lie on your back with your knees flexed 90 degrees. Push down with your feet and shoulders as you raise your hips a couple inches off the floor; hold for 10 seconds, repeat 5 to 10 times.  Back arches. Lie on your stomach, propping yourself up on bent elbows. Slowly press on your hands, causing an arch in  your low back. Repeat 3 to 5 times. Any initial stiffness and discomfort should lessen with repetition over time.  Shoulder-Lifts. Lie face down with arms beside your body. Keep hips and torso pressed to floor as you slowly lift your head and shoulders off the floor. Do not overdo your exercises, especially in the beginning. Exercises may cause you some mild back discomfort which lasts for a few minutes; however, if the pain is more severe, or lasts for more than 15 minutes, do not continue exercises until you see your caregiver. Improvement with exercise therapy for back problems is slow.  See your caregivers for assistance with developing a proper back exercise program. Document Released: 04/23/2004 Document Revised: 06/08/2011 Document Reviewed: 01/15/2011 [facility] Patient Information 2015 [place], [place]. This information is not intended to replace advice given to you by your health care provider. Make sure you discuss any questions you have with your health care provider.    Emergency Department Resource Guide 1) Find a Doctor and Pay Out of Pocket Although you won't have to find out who is covered by your insurance plan, it is a good idea to ask around and get recommendations. You will then need to call the office and see if the doctor you have chosen will accept you as a new patient and what types of options they offer for patients who are self-pay. Some doctors offer discounts or will set up payment plans for their patients who do not  have insurance, but you will need to ask so you aren't surprised when you get to your appointment.  2) Contact Your Local Health Department Not all health departments have doctors that can see patients for sick visits, but many do, so it is worth a call to see if yours does. If you don't know where your local health department is, you can check in your phone book. The CDC also has a tool to help you locate your state's health department, and many state websites  also have listings of all of their local health departments.  3) Find a Walk-in Clinic If your illness is not likely to be very severe or complicated, you may want to try a walk in clinic. These are popping up all over the country in pharmacies, drugstores, and shopping centers. They're usually staffed by nurse practitioners or physician assistants that have been trained to treat common illnesses and complaints. They're usually fairly quick and inexpensive. However, if you have serious medical issues or chronic medical problems, these are probably not your best option.  No Primary Care Doctor: - Call Health Connect at  [phone] - they can help you locate a primary care doctor that  accepts your insurance, provides certain services, etc. - Physician Referral Service- [phone]  Chronic Pain Problems: Organization         Address  Phone   Notes  [facility]  [phone] Patients need to be referred by their primary care doctor.   Medication Assistance: Organization         Address  Phone   Notes  [facility] [address] [place] [place], [place] [address] [phone] --Must be a resident of [facility] -- Must have NO insurance coverage whatsoever (no Medicaid/ Medicare, etc.) -- The pt. MUST have a primary care doctor that directs their care regularly and follows them in the community   [facility]ist  [phone]   [employer]  [phone]    Agencies that provide inexpensive medical care: Organization         Address  Phone   Notes  [facility]  [phone]   [facility]    [phone]   [facility] [address] [place], [place] [address] [phone]   [facility] [address] [place] [place] [phone]   Planned Parenthood    [phone]   [facility]    [phone]   [facility]   [address], [place] Phone:  [phone], Fax:  [phone] Hours of Operation:  9 am - 6 pm, M-F.  Also accepts Medicaid/Medicare and self-pay.  [facility]  [address], [place] Phone: [phone], Fax: [phone]. Hours of Operation:  8:30 am - 5:30 pm, M-F.  Also accepts Medicaid and self-pay.  [facility] [place] [address] [place] Phone: [phone]   [facility] [address] Natasha Bence [place], [place] [phone], Ext. [phone] Mondays & Thursdays: 7-9 AM.  First 15 patients are seen on a first come, first serve basis.    Medicaid-accepting [place] [facility] Providers:  Organization         Address  Phone   Notes  [facility] [address], [place] [phone] Also accepts self-pay patients.  [facility] [address], [place] ° ([phone]   °[facility] [address], [place] [phone]   °[facility] [address] [place] [address], [place] [phone]   °Veita Bland [address], [place]  ° [phone] Only accepts [place] Access Medicaid patients after they have their name applied to their card.  ° °Self-Pay (no insurance) in [place]: ° °Organization         Address  Phone   Notes  °Sickle Cell Patients, [facility] [address], [place] [phone]   °[facility] [address], [place] [phone]   ° [facility] [place] ° [address], [place] [phone]   °[facility]/Dr. Osei-Bonsu ° [address] [place] [address], [place] or [address], [place] [phone] Phone number for both [place] and [place] locations is the same.  °[facility] [address], [place] [phone]   °[facility] [address] [place] [address],  [place] or [address] [phone] °([phone]) [phone]   °[facility] [address], [place] [phone]) [phone], phone; ([phone], fax Sees patients 1st and 3rd Saturday of every month.  Must not qualify for public or private insurance (i.e. Medicaid, Medicare, [place] Health Choice, Veterans' Benefits) • Household income should be no more than 200% of the poverty level •The clinic cannot treat you if you are pregnant or think you are pregnant • Sexually transmitted diseases are not treated at the clinic.  ° ° °Dental Care: °Organization         Address  Phone  Notes  °[place] [facility] [address],  ([phone]) [phone] Accepts children up to age 21 who are enrolled in Medicaid or [place] Health Choice; pregnant women with a Medicaid card; and children who have applied for Medicaid or [place] Health Choice, but were declined, whose parents can pay a reduced fee at time of service.  °[place] [facility] [place]  [address], [place] [phone]) [phone] Accepts children up to age 21 who are enrolled in Medicaid or [place] Health Choice; pregnant women with a Medicaid card; and children who have applied for Medicaid or  Health Choice, but were declined, whose parents can pay a reduced fee at time of service.  °[facility] ° [address],  ([phone] Patients are seen by appointment only. Walk-ins are not accepted. [facility] will see patients 18 years of age and older. °Monday - Tuesday (8am-5pm) °Most Wednesdays (8:30-5pm) °$30 per visit, cash only  °[facility] ° [address], [place] [phone] Patients are seen by appointment only. Walk-ins are not accepted. [facility] will see patients 18 years of age and older. °One Wednesday Evening (Monthly: Volunteer Based).  $30 per visit, cash only  °[facility]  ([phone] for adults; Children under age 4, call Graduate Pediatric Dentistry at [phone]. Children aged 4-14, please call [phone] to request a pediatric application. ° Dental services are provided in all areas of dental care including fillings, crowns and bridges, complete and partial dentures, implants, gum treatment, root canals, and extractions. Preventive care is also provided. Treatment is provided to both adults and children. °Patients are selected   via a lottery and there is often a waiting list.   [facility] [address], [place]  [phone] [email or web address]   [facility] [address] [place], [place] [phone], Ext. [phone] Second and Fourth Thursday of each month, opens at 6:30 AM; Clinic ends at 9 AM.  Patients are seen on a first-come first-served basis, and a limited number are seen during each clinic.   [facility]  [address] Hillard Danker [place], [place] [phone]   Eligibility Requirements You must have lived in [place], [place], or [place] counties for at least the last three months.   You cannot be eligible for state or federal sponsored [employer], including [employer], [place], or [employer].   You generally cannot be eligible for healthcare insurance through your employer.    How to apply: Eligibility screenings are held every Tuesday and Wednesday afternoon from 1:00 pm until 4:00 pm. You do not need an appointment for the interview!  [facility] [address], [place], [place]   [place]  [place] Department  [place]  [phone]    Behavioral Health Resources in the Community: Intensive Outpatient Programs Organization         Address  Phone  Notes  [facility] [address] [place], [place] [phone]     [facility] Outpatient [address] [place], [place]   [facility]: [facility] [address] [place], [place]   [place] [address],  [place], [place] or [phone]   Substance Abuse Resources Organization         Address  Phone  Notes  [facility]  919-180-0944   Loop  (504)[phone]   [facility]   Chinita Pester  [phone]   Residential & Outpatient Substance Abuse Program  [phone]   Psychological Services Organization         Address  Phone  Notes  [facility] [place]  [place]  [phone]   [place] [address], [place] or ([phone]    Mobile Crisis Teams Organization         Address  Phone  Notes  [facility], Mobile Crisis Care Unit  (913)583-5610   [facility]  [address] [place], [place]   [place] [address], [place] [phone]    Self-Help/Support Groups Organization         Address  Phone             Notes  [place]. of [place] - variety of support groups  [place] Call for more information  Narcotics Anonymous (NA), [facility] [address], [employer] [place]  2 meetings at this location   Special educational needs teacher         Address  Phone  Notes  [facility] [place],    [place]  1-(843)591-0072   [facility]  [address] [place] [address]24097, [place], [place]   [place], [place] [phone] Admissions: 8am-3pm M-F  Incentives Substance [place] [address],    [place], [place]   [facility]  Starling Manns [place], [place] [phone]   [facility] [address]  [place],  [place] [phone]   [facility] - Intensive Outpatient [address], [place], [place] [phone]   [facility] [address] [place]  [place], [place] [phone] or [phone]   [facility] ([facility]) [address], [place], [place] [phone] Accepts Medicaid  [facility] [address],  [place] [phone] Substance Abuse/Addiction Treatment   [facility] Organization         Address  Phone  Notes  [facility]  [phone]   Angie Fava, PhD [address] Ervin Knack [place], [place]   [phone] or (934)[phone]   [facility]   [address] [place], [place] [phone]   [facility] [address] [place], [place] [phone] Insurance/Medicaid/sponsorship through [facility] [address]                                    [place], [place] [phone] Therapy/tele-psych/case  [facility] [address], [place] [phone]    Dr. Lolly Mustache  [phone]   [facility] [place]  [employer] [facility] Dept. 1) [address] [place] 2) [address] [place] 3)  [address], [place] [phone]  [phone]   [facility] Child Abuse Hotline 936-443-9609 or [phone] (After Hours)

## 2014-10-25 NOTE — ED Provider Notes (Signed)
CSN: [id number]     Arrival date & time 10/25/14  2105 History  This chart is scribed for non-physician practitioner, Dierdre Forth, PA-C, working with Linwood Dibbles, MD by Abel Presto, ED Scribe.  This patient was seen in room TR06C/TR06C and the patient's care was started 10:08 PM.      Chief Complaint  Patient presents with  . Back Pain     The history is provided by the patient and medical records. No language interpreter was used.   HPI Comments: Marcus Dennis is a 34 y.o. male who presents to the Emergency Department complaining of low back pain worse on left with onset 1 week ago, worsening this morning around 3 PM.  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 He reports pain radiation into left leg aggravated by ambulation. He states moving left leg aggravates the pain. He is able to ambulate with difficulty and limp. Pt was given ibuprofen at work with no relief. He denies h/o kidney problems. Wife states pt has h/o back injury before moving to the [place] but she does not know the details. Pt denies abdominal pain, dysuria, and hematuria. He denies recent injury or trauma.   Past Medical History  Diagnosis Date  . Pneumonia    History reviewed. No pertinent past surgical history. No family history on file. History  Substance Use Topics  . Smoking status: Current Every Day Smoker -- 1.00 packs/day    Types: Cigarettes  . Smokeless tobacco: Not on file  . Alcohol Use: No    Review of Systems  Constitutional: Negative for fever and fatigue.  Respiratory: Negative for chest tightness and shortness of breath.   Cardiovascular: Negative for chest pain.  Gastrointestinal: Negative for nausea, vomiting, abdominal pain and diarrhea.  Genitourinary: Negative for dysuria, urgency, frequency and hematuria.  Musculoskeletal: Positive for myalgias, back pain and gait problem ( 2/2 pain). Negative for joint swelling, neck pain and neck  stiffness.  Skin: Negative for rash.  Neurological: Negative for weakness, light-headedness, numbness and headaches.  All other systems reviewed and are negative.     Allergies  Review of patient's allergies indicates no known allergies.  Home Medications   Prior to Admission medications   Medication Sig Start Date End Date Taking? Authorizing Provider  HYDROcodone-acetaminophen (NORCO/VICODIN) 5-325 MG per tablet Take 1-2 tablets by mouth every 6 (six) hours as needed for moderate pain or severe pain. 10/25/14   Krystl Wickware, PA-C  methocarbamol (ROBAXIN) 500 MG tablet Take 1 tablet (500 mg total) by mouth 2 (two) times daily. 10/25/14   Hargun Spurling, PA-C  naproxen (NAPROSYN) 500 MG tablet Take 1 tablet (500 mg total) by mouth 2 (two) times daily with a meal. 10/25/14   Grettel Rames, PA-C  predniSONE (DELTASONE) 20 MG tablet Take 2 tablets (40 mg total) by mouth daily. 10/25/14   Jorrell Kuster, PA-C  sulfamethoxazole-trimethoprim (SEPTRA DS) 800-160 MG per tablet Take 1 tablet by mouth every 12 (twelve) hours. 01/08/14   Shari Upstill, PA-C   BP 113/82 mmHg  Pulse 55  Temp(Src) 98 F (36.7 C) (Oral)  Resp 16  SpO2 100% Physical Exam  Constitutional: He is oriented to person, place, and time. He appears well-developed and well-nourished. No distress.  HENT:  Head: Normocephalic and atraumatic.  Mouth/Throat: Oropharynx is clear and moist. No oropharyngeal exudate.  Eyes: Conjunctivae are normal.  Neck: Normal range of motion. Neck supple.  Full ROM without pain  Cardiovascular: Normal  rate, regular rhythm and intact distal pulses.   Pulmonary/Chest: Effort normal and breath sounds normal. No respiratory distress. He has no wheezes.  Abdominal: Soft. He exhibits no distension. There is no tenderness.  Musculoskeletal:  Full range of motion of the T-spine and L-spine No tenderness to palpation of the spinous processes of the T-spine or  L-spine Significant tenderness to palpation of the bilateral paraspinous muscles of the L-spine Negative straight leg raise  Lymphadenopathy:    He has no cervical adenopathy.  Neurological: He is alert and oriented to person, place, and time. He has normal reflexes.  Reflex Scores:      Bicep reflexes are 2+ on the right side and 2+ on the left side.      Brachioradialis reflexes are 2+ on the right side and 2+ on the left side.      Patellar reflexes are 2+ on the right side and 2+ on the left side.      Achilles reflexes are 2+ on the right side and 2+ on the left side. Speech is clear and goal oriented, follows commands Normal 5/5 strength in upper and lower extremities bilaterally including dorsiflexion and plantar flexion, strong and equal grip strength Sensation normal to light and sharp touch Moves extremities without ataxia, coordination intact Antalgic gait Normal balance No Clonus   Skin: Skin is warm and dry. No rash noted. He is not diaphoretic. No erythema.  Psychiatric: He has a normal mood and affect. His behavior is normal.  Nursing note and vitals reviewed.   ED Course  Procedures (including critical care time) DIAGNOSTIC STUDIES: Oxygen Saturation is 99% on room air, normal by my interpretation.    COORDINATION OF CARE: 10:14 PM Discussed treatment plan with patient at beside, the patient agrees with the plan and has no further questions at this time.   Labs Review Labs Reviewed - No data to display  Imaging Review No results found.   EKG Interpretation None      MDM   Final diagnoses:  Lumbar strain, initial encounter  Bilateral low back pain with left-sided sciatica   [name] presents with nontraumatic lower back pain.  No neurological deficits and normal neuro exam.  Radiation of pain into the right leg consistent with sciatica.  Patient can walk but states it is painful.  No loss of bowel or bladder control.  No concern for cauda equina.   No fever, night sweats, weight loss, h/o cancer, IVDU.  RICE protocol and pain medicine indicated and discussed with patient.   BP 113/82 mmHg  Pulse 55  Temp(Src) 98 F (36.7 C) (Oral)  Resp 16  SpO2 100%  I personally performed the services described in this documentation, which was scribed in my presence. The recorded information has been reviewed and is accurate.   Dahlia Client Naitik Hermann, PA-C 10/25/14 2311  Linwood Dibbles, MD 10/27/14 [phone]

## 2014-10-25 NOTE — ED Notes (Signed)
Pt reports to the ED for eval of low back pain that radiates into his left leg. Reports the pain began yesterday and has become progressively worse. He does a lot of heavy lifting at his job. Pt denies any numbness, tingling, or paralysis. Pt A&Ox4, resp e/u, and skin warm and dry.

## 2015-01-31 ENCOUNTER — Emergency Department (HOSPITAL_COMMUNITY)
Admission: EM | Admit: 2015-01-31 | Discharge: 2015-01-31 | Disposition: A | Discharge status: Home / Self Care | Payer: Medicaid Other | Attending: Emergency Medicine | Admitting: Emergency Medicine

## 2015-01-31 ENCOUNTER — Encounter (HOSPITAL_COMMUNITY): Payer: Self-pay | Admitting: Emergency Medicine

## 2015-01-31 ENCOUNTER — Emergency Department (HOSPITAL_COMMUNITY): Payer: Medicaid Other

## 2015-01-31 DIAGNOSIS — M79641 Pain in right hand: Secondary | ICD-10-CM | POA: Insufficient documentation

## 2015-01-31 DIAGNOSIS — Z8701 Personal history of pneumonia (recurrent): Secondary | ICD-10-CM | POA: Insufficient documentation

## 2015-01-31 DIAGNOSIS — Z791 Long term (current) use of non-steroidal anti-inflammatories (NSAID): Secondary | ICD-10-CM | POA: Insufficient documentation

## 2015-01-31 DIAGNOSIS — Z720 Tobacco use: Secondary | ICD-10-CM | POA: Insufficient documentation

## 2015-01-31 DIAGNOSIS — M79644 Pain in right finger(s): Secondary | ICD-10-CM

## 2015-01-31 MED ORDER — IBUPROFEN 800 MG PO TABS: 800.0000 mg | ORAL_TABLET | Freq: Three times a day (TID) | ORAL | Status: DC

## 2015-01-31 NOTE — ED Notes (Signed)
Right hand swelling started 2 days ago. States he uses it repeatively with work. Unilateral swelling.

## 2015-01-31 NOTE — ED Provider Notes (Signed)
CSN: [id number]     Arrival date & time 01/31/15  [id number] History  By signing my name below, I, Emmanuella Mensah, attest that this documentation has been prepared under the direction and in the presence of Melburn HakeNicole Kassandra Meriweather, [place]. Electronically Signed: Angelene GiovanniEmmanuella Mensah, ED Scribe. 01/31/2015. 10:53 AM.     Chief Complaint  Patient presents with  . Hand Problem   The history is provided by the patient. No language interpreter was used.   HPI Comments: Marcus Dennis is a 34 y.o. male who presents to the Emergency Department complaining of gradually worsening intermittent throbbing right index finger pain with constant swelling onset 2 days ago. He denies any numbness or tingling or weakness. He also denies taking any medication or using ice for relief. 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 He states that he usually tapes his fingers while working but does not use a brace or gloves and notes he has not been using the tape this week. He denies any lacerations or recent injuries. He reports that his right index finger has been swollen in the past but he used Ibuprofen without needing to come to the ER.   Past Medical History  Diagnosis Date  . Pneumonia    History reviewed. No pertinent past surgical history. History reviewed. No pertinent family history. Social History  Substance Use Topics  . Smoking status: Current Every Day Smoker -- 1.00 packs/day    Types: Cigarettes  . Smokeless tobacco: None  . Alcohol Use: No    Review of Systems  Musculoskeletal: Positive for joint swelling and arthralgias.  Neurological: Negative for numbness.      Allergies  Review of patient's allergies indicates no known allergies.  Home Medications   Prior to Admission medications   Medication Sig Start Date End Date Taking? Authorizing Provider  HYDROcodone-acetaminophen (NORCO/VICODIN) 5-325 MG per tablet Take 1-2 tablets by mouth every 6 (six) hours as needed for moderate  pain or severe pain. 10/25/14   Hannah Muthersbaugh, PA-C  methocarbamol (ROBAXIN) 500 MG tablet Take 1 tablet (500 mg total) by mouth 2 (two) times daily. 10/25/14   Hannah Muthersbaugh, PA-C  naproxen (NAPROSYN) 500 MG tablet Take 1 tablet (500 mg total) by mouth 2 (two) times daily with a meal. 10/25/14   Hannah Muthersbaugh, PA-C  predniSONE (DELTASONE) 20 MG tablet Take 2 tablets (40 mg total) by mouth daily. 10/25/14   Hannah Muthersbaugh, PA-C  sulfamethoxazole-trimethoprim (SEPTRA DS) 800-160 MG per tablet Take 1 tablet by mouth every 12 (twelve) hours. 01/08/14   Shari Upstill, PA-C   BP 113/75 mmHg  Pulse 68  Temp(Src) 98.5 F (36.9 C) (Oral)  Resp 16  SpO2 100% Physical Exam  Constitutional: He is oriented to person, place, and time. He appears well-developed and well-nourished. No distress.  HENT:  Head: Normocephalic and atraumatic.  Eyes: Conjunctivae and EOM are normal.  Neck: Neck supple. No tracheal deviation present.  Cardiovascular: Normal rate.   Pulmonary/Chest: Effort normal. No respiratory distress.  Musculoskeletal:       Right hand: He exhibits decreased range of motion, tenderness and swelling (Right index finger). He exhibits no bony tenderness, normal capillary refill, no deformity and no laceration. Normal sensation noted. Normal strength noted.  Mild swelling noted to right index finger around PIP joint, no surrounding erythema or drainage noted, mildly TTP.  No areas of induration.  Decreased ROM of right index PIP joint due to pain.  Full passive ROM of PIP, DIP, and  MCP joint.  Sensation intact 2+ radial pulse Cap refill less than 2 Grip strength equal bilaterally  Neurological: He is alert and oriented to person, place, and time. He has normal strength. No sensory deficit.  Skin: Skin is warm and dry.  Psychiatric: He has a normal mood and affect. His behavior is normal.  Nursing note and vitals reviewed.   ED Course  Procedures (including critical  care time) DIAGNOSTIC STUDIES: Oxygen Saturation is 100% on RA, normal by my interpretation.    COORDINATION OF CARE: 10:19 AM- Pt advised of plan for treatment and pt agrees. Pt will receive 800 mg of Ibuprofen and advised to ice his hand while elevated. Pt informed of his X-ray results. 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 Pt recommended to return if he develops a fever,    Imaging Review Dg Hand Complete Right  01/31/2015  CLINICAL DATA:  Work injury 2 days ago. Pain to PIP joint of the index finger. EXAM: RIGHT HAND - COMPLETE 3+ VIEW COMPARISON:  None. FINDINGS: There is no evidence of fracture or dislocation. There is no evidence of arthropathy or other focal bone abnormality. Soft tissues are unremarkable. IMPRESSION: Negative. Electronically Signed   By: Signa Kell M.D.   On: 01/31/2015 08:58   Melburn Hake, PA-C has personally reviewed and evaluated these images as part of her medical decision-making.  Filed Vitals:   01/31/15 1053  BP: 120/72  Pulse: 58  Temp: 98 F (36.7 C)  Resp: 16     MDM   Final diagnoses:  Finger pain, right   Pt presents with right index finger swelling and pain. Denies any injury. Reports using repetitive motion at work on machinery with his index finger. VSS. Exam reveals mild swelling to index finger with dec. ROM at PIP joint due to pain, right hand neurovascularly intact. Hand xray negative. I do not suspect septic joint of any infection at this time. Plan to d/c pt home with NSAIDs and advised pt to use ice and rest finger. Pt given resource guide to follow up with PCP.  Evaluation does not show pathology requring ongoing emergent intervention or admission. Pt is hemodynamically stable and mentating appropriately. Discussed findings/results and plan with patient/guardian, who agrees with plan. All questions answered. Return precautions discussed and outpatient follow up given.     I personally performed the services  described in this documentation, which was scribed in my presence. The recorded information has been reviewed and is accurate.   Satira Sark [place], [place] 01/31/15 2009  Glynn Octave, MD 02/01/15 [phone]

## 2015-01-31 NOTE — Discharge Instructions (Signed)
Take your medications as prescribed. You may also use ice for pain relief. Please follow up with a primary care provider from the Resource Guide provided below in 1 week. Please return to the Emergency Department if symptoms worsen or new onset of fever, numbness, tingling, weakness.   Emergency Department Resource Guide 1) Find a Doctor and Pay Out of Pocket Although you won't have to find out who is covered by your insurance plan, it is a good idea to ask around and get recommendations. You will then need to call the office and see if the doctor you have chosen will accept you as a new patient and what types of options they offer for patients who are self-pay. Some doctors offer discounts or will set up payment plans for their patients who do not have insurance, but you will need to ask so you aren't surprised when you get to your appointment.  2) Contact Your Local Health Department Not all health departments have doctors that can see patients for sick visits, but many do, so it is worth a call to see if yours does. If you don't know where your local health department is, you can check in your phone book. The CDC also has a tool to help you locate your state's health department, and many state websites also have listings of all of their local health departments.  3) Find a Walk-in Clinic If your illness is not likely to be very severe or complicated, you may want to try a walk in clinic. These are popping up all over the country in pharmacies, drugstores, and shopping centers. They're usually staffed by nurse practitioners or physician assistants that have been trained to treat common illnesses and complaints. They're usually fairly quick and inexpensive. However, if you have serious medical issues or chronic medical problems, these are probably not your best option.  No Primary Care Doctor: - Call Health Connect at  [phone] - they can help you locate a primary care doctor that  accepts your  insurance, provides certain services, etc. - Physician Referral Service- [phone]  Chronic Pain Problems: Organization         Address  Phone   Notes  [facility]  [phone] Patients need to be referred by their primary care doctor.   Medication Assistance: Organization         Address  Phone   Notes  [facility] [address] [place], [place] [address] [phone] --Must be a resident of [facility] [place] -- Must have NO insurance coverage whatsoever (no Medicaid/ Medicare, etc.) -- The pt. MUST have a primary care doctor that directs their care regularly and follows them in the community   MedAssist  [phone]   [employer]  [phone]    Agencies that provide inexpensive medical care: Organization         Address  Phone   Notes  [facility]  [phone]   [facility]    [phone]   [facility] [address] [place], [place] [address] [phone]   [facility] [address], [place] [phone]   Planned Parenthood    [phone]   [facility]    [phone]   [facility]  [address], [place] Phone:  [phone], Fax:  [phone] Hours of Operation:  9 am - 6  pm, M-F.  Also accepts Medicaid/Medicare and self-pay.  [facility] for [address] [place], [address], [place] Phone: [phone], Fax: [phone]. Hours of Operation:  8:30 am - 5:30 pm, M-F.  Also accepts Medicaid and self-pay.  [facility] [address] [place] Phone: [phone]   [place], [place], [place] [phone], Ext. [phone] Mondays & Thursdays: 7-9 AM.  First 15 patients are seen on a first come, first serve basis.    [facility] Providers:  Organization          Address  Phone   Notes  [facility] [address], [place] [phone] Also accepts self-pay patients.  [facility] [address], [place]  [phone]   [place], [address], [place] [phone]   [facility] [address] [place] [phone]   Lucianne Lei [address], [place]   [phone] Only accepts [place] Access [place] patients after they have their name applied to their card.   Self-Pay (no insurance) in [facility]:  Organization         Address  Phone   Notes  Sickle Cell Patients, [facility] [place] [phone]   [facility] [place] [phone]   [facility] [place]  [place], [place], [place] [phone]   [facility]/Dr. Osei-Bonsu  [address] [place] or [address], [place] [phone] Phone number for both [place] and [place] locations is the same.  Urgent Medical and [facility] [address], [place] [phone]   [facility] [address] [place] or [address] [phone]   [facility] [address] [place] [phone], phone; [phone], fax Sees patients 1st and 3rd Saturday of every month.  Must not qualify for public or private insurance (i.e. Medicaid, Medicare, [place] Health Choice, Veterans' Benefits)  Household income should be no more than 200% of the poverty level The clinic cannot treat you if you are pregnant or think you are pregnant  Sexually transmitted diseases are not treated at the clinic.    Dental Care: Organization         Address  Phone  Notes  [facility] [place] [phone] Accepts children up to age 78 who are enrolled in [place] or [place]; pregnant women with a Medicaid card; and children who have applied for Medicaid or [place] Health Choice, but were declined, whose parents can pay a reduced fee at time of service.  [facility] of [facility]  [address], [place] [phone] Accepts children up to age 76 who are enrolled in [place] or [place]; pregnant women with a Medicaid card; and children who have applied for Medicaid or  Health Choice, but were declined, whose parents can pay a reduced fee at time of service.  [place] Adult [facility] Access PROGRAM  [place] [phone] Patients are seen by appointment only. Walk-ins are not accepted. [place] will see patients 3 years of age and older.  Monday - Tuesday (8am-5pm) Most Wednesdays (8:30-5pm) $30 per visit, cash only  [facility] Adult Dental Access PROGRAM  [address], [facility] [phone] Patients are seen by appointment only. Walk-ins are not accepted. [place] will see patients 66 years of age and older. One Wednesday Evening (Monthly: Volunteer Based).  $30 per visit, cash only  [place]  [phone] for adults; Children under age 42, call Graduate Pediatric Dentistry at [phone]. Children aged 27-14, please call [phone] to request a pediatric application.  Dental services are provided in all areas of dental care including fillings, crowns and bridges, complete and partial dentures, implants, gum treatment, root canals, and extractions. Preventive care is also provided. Treatment is provided to both adults and children. Patients are selected via a lottery and there is often a waiting list.   [facility] [address], [place]  [phone] [email or web address]   [facility] [address] [place], [place]  [phone], Ext. [phone] Second and Fourth Thursday of each month, opens at 6:30 AM; Clinic ends at 9 AM.  Patients are seen on a first-come first-served basis, and a limited number are seen during each clinic.   [facility]  [address] Hillard Danker [place], [place] [phone]   Eligibility Requirements You must have lived in [place], [place], or [place] counties for at least the last three months.   You cannot be eligible for state or federal sponsored [employer], including [employer], [place], or [employer].   You generally cannot be eligible for healthcare insurance through your employer.    How to apply: Eligibility screenings are held every Tuesday and Wednesday afternoon from 1:00 pm until 4:00 pm. You do not need an appointment for the interview!  [facility] [address], [place], [place]   [place]  [place] Department  [place]  [phone]    Behavioral Health Resources in the Community: Intensive Outpatient Programs Organization         Address  Phone  Notes  [place]. [address], [place], [place] [phone]   [facility] Outpatient [address], [place], [place]   ADS: Alcohol & Drug Svcs [address], [place], [place]   [place] [address]  [place], [place] or [phone]   Substance Abuse Resources Organization         Address  Phone  Notes  Alcohol and Drug Services  [phone]   [place]  [phone]   [facility] [place]   Chinita Pester  [phone]   Residential & Outpatient Substance Abuse Program  [phone]   Psychological Services Organization         Address  Phone  Notes  [facility]  [place]  [phone]    [place] [address] [place] or [phone]    Mobile Crisis Teams Organization         Address  Phone  Notes  [facility], Mobile Crisis Care Unit  [phone]   [facility]  [address] [place], [place]   [place] Levels [address] [place] [phone]    Self-Help/Support Groups Organization         Address  Phone  Notes  [facility] [place] - variety of support groups  [place] Call for more information  Narcotics Anonymous (NA), [facility] [address], [employer] [place]  2 meetings at this location   Special educational needs teacher         Address  Phone  Notes  ASAP Residential Treatment [place],    [place]  [phone]   [facility]  [address], [place] [phone], [place], [place]   [place], [place] [phone] Admissions: 8am-3pm M-F  Incentives Substance [place] [address]    [place], [place] [phone]   [facility] [address] [place], [place], [place]   [facility] [address]  [place], [place]   [facility] - Intensive Outpatient [address] Kristeen [address], [place], [place]   [facility] ([place].,  [place], [place] [phone] or [phone]   [facility] Treatment Services (RTS) [address], [place], [place] Accepts Medicaid  [facility] [address]  [place] [phone] Substance Abuse/Addiction Treatment   [facility] Organization         Address  Phone  Notes  [facility]  [phone]   Domenic Schwab, PhD [address] Arlis Porta [place], [place]   [phone] or [phone]   [place]  [place], [place] [phone]   [facility] [address], [place], [place] [phone] Insurance/Medicaid/sponsorship through [facility] and Families [address]                                    [place], [place] [phone] [place] [address] [place], [place] [phone]    Dr. Adele Schilder  [phone]   [facility] [place] Dept. 1) [address] [place] 2) [place] 3)  [place], [place] [phone]  [phone]   [place] [phone] or [phone] (After Hours)

## 2015-01-31 NOTE — ED Notes (Signed)
Declined W/C at D/C and was escorted to lobby by RN. 

## 2015-10-14 ENCOUNTER — Emergency Department (HOSPITAL_COMMUNITY)
Admission: EM | Admit: 2015-10-14 | Discharge: 2015-10-14 | Disposition: A | Discharge status: Home / Self Care | Payer: Self-pay | Attending: Emergency Medicine | Admitting: Emergency Medicine

## 2015-10-14 ENCOUNTER — Encounter (HOSPITAL_COMMUNITY): Payer: Self-pay

## 2015-10-14 ENCOUNTER — Emergency Department (HOSPITAL_COMMUNITY): Payer: Self-pay

## 2015-10-14 DIAGNOSIS — S065X0A Traumatic subdural hemorrhage without loss of consciousness, initial encounter: Secondary | ICD-10-CM | POA: Insufficient documentation

## 2015-10-14 DIAGNOSIS — S065XAA Traumatic subdural hemorrhage with loss of consciousness status unknown, initial encounter: Secondary | ICD-10-CM

## 2015-10-14 DIAGNOSIS — S0191XA Laceration without foreign body of unspecified part of head, initial encounter: Secondary | ICD-10-CM | POA: Insufficient documentation

## 2015-10-14 DIAGNOSIS — S0181XA Laceration without foreign body of other part of head, initial encounter: Secondary | ICD-10-CM

## 2015-10-14 DIAGNOSIS — S01112A Laceration without foreign body of left eyelid and periocular area, initial encounter: Secondary | ICD-10-CM | POA: Insufficient documentation

## 2015-10-14 DIAGNOSIS — Y939 Activity, unspecified: Secondary | ICD-10-CM | POA: Insufficient documentation

## 2015-10-14 DIAGNOSIS — F17210 Nicotine dependence, cigarettes, uncomplicated: Secondary | ICD-10-CM | POA: Insufficient documentation

## 2015-10-14 DIAGNOSIS — Y999 Unspecified external cause status: Secondary | ICD-10-CM | POA: Insufficient documentation

## 2015-10-14 DIAGNOSIS — S065X9A Traumatic subdural hemorrhage with loss of consciousness of unspecified duration, initial encounter: Secondary | ICD-10-CM

## 2015-10-14 DIAGNOSIS — Y929 Unspecified place or not applicable: Secondary | ICD-10-CM | POA: Insufficient documentation

## 2015-10-14 MED ORDER — MORPHINE SULFATE (PF) 4 MG/ML IV SOLN
4.0000 mg | Freq: Once | INTRAVENOUS | Status: AC
  Administered 2015-10-14: 4 mg via INTRAVENOUS
  Filled 2015-10-14: qty 1

## 2015-10-14 MED ORDER — LIDOCAINE-EPINEPHRINE (PF) 2 %-1:200000 IJ SOLN
10.0000 mL | Freq: Once | INTRAMUSCULAR | Status: AC
  Administered 2015-10-14: 10 mL
  Filled 2015-10-14: qty 20

## 2015-10-14 MED ORDER — TETANUS-DIPHTH-ACELL PERTUSSIS 5-2.5-18.5 LF-MCG/0.5 IM SUSP
0.5000 mL | Freq: Once | INTRAMUSCULAR | Status: AC
  Administered 2015-10-14: 0.5 mL via INTRAMUSCULAR
  Filled 2015-10-14: qty 0.5

## 2015-10-14 MED ORDER — OXYCODONE-ACETAMINOPHEN 5-325 MG PO TABS: 1.0000 | ORAL_TABLET | ORAL | Status: DC | PRN

## 2015-10-14 MED ORDER — GADOBENATE DIMEGLUMINE 529 MG/ML IV SOLN
13.0000 mL | Freq: Once | INTRAVENOUS | Status: AC | PRN
  Administered 2015-10-14: 13 mL via INTRAVENOUS
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
## 2015-10-14 NOTE — ED Notes (Signed)
Pt ambulated to and from BR.

## 2015-10-14 NOTE — ED Notes (Signed)
Pt. Transported to CT at this time.  

## 2015-10-14 NOTE — ED Notes (Signed)
Family at bedside with patient, patient is eating, denies any complaints, awaiting neurologist consult post second head ct

## 2015-10-14 NOTE — ED Notes (Signed)
Pt returned from mri and placed back on monitor 

## 2015-10-14 NOTE — ED Notes (Signed)
Patient and family instructed that the neurologist would like a repeat ct scan at 7 and the patient is being encouraged to walk to the bathroom and eat and drink, patient is tolerating po fluids and has ambulated to the bathroom with a steady gait, family and patient understand and do not have any concerns, will continue to monitor
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
## 2015-10-14 NOTE — ED Notes (Signed)
Transporter Shirlean Mylarheryl Gibson, picking up pt for MRI.
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
## 2015-10-14 NOTE — Discharge Instructions (Signed)
Medications: Percocet  Treatment: Take Percocet as prescribed for your pain. Do not drive or operate machinery while taking this medication. At this time tomorrow, wash your wounds with warm soapy water. Apply antibiotic ointment and clean dressing. Do this daily until you return in 7 days for suture removal.  Follow-up: Please return to the emergency department in 7 days for suture removal. Please return sooner if you develop any fevers, increasing pain, redness, swelling, drainage, red streaking from your wounds, unremitting headache, nausea, vomiting, dizziness, numbness, or any other new or concerning symptom.

## 2015-10-25 ENCOUNTER — Emergency Department (HOSPITAL_COMMUNITY): Payer: Self-pay

## 2015-10-25 ENCOUNTER — Emergency Department (HOSPITAL_COMMUNITY)
Admission: EM | Admit: 2015-10-25 | Discharge: 2015-10-25 | Disposition: A | Discharge status: Home / Self Care | Payer: Self-pay | Attending: Emergency Medicine | Admitting: Emergency Medicine

## 2015-10-25 ENCOUNTER — Encounter (HOSPITAL_COMMUNITY): Payer: Self-pay | Admitting: *Deleted

## 2015-10-25 DIAGNOSIS — S065X0D Traumatic subdural hemorrhage without loss of consciousness, subsequent encounter: Secondary | ICD-10-CM | POA: Insufficient documentation

## 2015-10-25 DIAGNOSIS — S060X0D Concussion without loss of consciousness, subsequent encounter: Secondary | ICD-10-CM | POA: Insufficient documentation

## 2015-10-25 DIAGNOSIS — F17210 Nicotine dependence, cigarettes, uncomplicated: Secondary | ICD-10-CM | POA: Insufficient documentation

## 2015-10-25 DIAGNOSIS — S065X9A Traumatic subdural hemorrhage with loss of consciousness of unspecified duration, initial encounter: Secondary | ICD-10-CM

## 2015-10-25 DIAGNOSIS — S0542XD Penetrating wound of orbit with or without foreign body, left eye, subsequent encounter: Secondary | ICD-10-CM | POA: Insufficient documentation

## 2015-10-25 DIAGNOSIS — S065XAA Traumatic subdural hemorrhage with loss of consciousness status unknown, initial encounter: Secondary | ICD-10-CM

## 2015-10-25 MED ORDER — ACETAMINOPHEN 325 MG PO TABS
650.0000 mg | ORAL_TABLET | Freq: Once | ORAL | Status: AC
  Administered 2015-10-25: 650 mg via ORAL
  Filled 2015-10-25: qty 2

## 2015-10-25 MED ORDER — ACETAMINOPHEN 325 MG PO TABS: 650.0000 mg | ORAL_TABLET | Freq: Four times a day (QID) | ORAL | 0 refills | Status: DC | PRN

## 2015-10-25 NOTE — Discharge Instructions (Signed)
Take medication as needed for pain. Follow up with your primary care provider. You do not need to follow up with neurosurgery unless symptoms worsen or change.

## 2015-10-25 NOTE — ED Notes (Signed)
Pt moved to POD E-48. Report given to RN. Awaiting CT.Marland Kitchen

## 2015-10-25 NOTE — ED Notes (Signed)
Patient transported to CT 

## 2015-10-25 NOTE — ED Notes (Signed)
Sutures intact to left face. Healing well. Pt c/o continued headache.

## 2015-10-25 NOTE — ED Notes (Signed)
Charge nurse notified of pt's need to move to acute bed. Needs CT scan.

## 2015-10-25 NOTE — ED Notes (Signed)
Sutures removed by PA student.

## 2015-10-25 NOTE — ED Notes (Signed)
Suture Removal Kit at Bedside.
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
## 2015-10-25 NOTE — ED Notes (Signed)
Returned from CT and updated on plan of care.
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
## 2016-07-28 ENCOUNTER — Ambulatory Visit (HOSPITAL_COMMUNITY)
Admission: EM | Admit: 2016-07-28 | Discharge: 2016-07-28 | Disposition: A | Discharge status: Home / Self Care | Payer: Medicaid Other | Attending: Family Medicine | Admitting: Family Medicine

## 2016-07-28 ENCOUNTER — Encounter (HOSPITAL_COMMUNITY): Payer: Self-pay | Admitting: Family Medicine

## 2016-07-28 DIAGNOSIS — R519 Headache, unspecified: Secondary | ICD-10-CM

## 2016-07-28 DIAGNOSIS — G44309 Post-traumatic headache, unspecified, not intractable: Secondary | ICD-10-CM

## 2016-07-28 DIAGNOSIS — J189 Pneumonia, unspecified organism: Secondary | ICD-10-CM | POA: Insufficient documentation

## 2016-07-28 DIAGNOSIS — R51 Headache: Secondary | ICD-10-CM | POA: Diagnosis not present

## 2016-07-28 DIAGNOSIS — W19XXXA Unspecified fall, initial encounter: Secondary | ICD-10-CM

## 2016-07-28 MED ORDER — KETOROLAC TROMETHAMINE 30 MG/ML IJ SOLN
30.0000 mg | Freq: Once | INTRAMUSCULAR | Status: AC
  Administered 2016-07-28: 30 mg via INTRAMUSCULAR

## 2016-07-28 MED ORDER — KETOROLAC TROMETHAMINE 30 MG/ML IJ SOLN
INTRAMUSCULAR | Status: AC
  Filled 2016-07-28: qty 1

## 2016-07-28 NOTE — ED Triage Notes (Signed)
Pt here for head injury that occurred on Friday. sts that he fell and struck head on some steps. sts since has been having headache and dizziness. Per family pt did not loose consciousness. sts he hasnt been able to work since this happened. Denise any vision problems.

## 2016-07-28 NOTE — ED Provider Notes (Signed)
CSN: [id number]     Arrival date & time 07/28/16  1227 History   First MD Initiated Contact with Patient 07/28/16 1245     Chief Complaint  Patient presents with  . Head Injury   (Consider location/radiation/quality/duration/timing/severity/associated sxs/prior Treatment) 36 year old male presents to clinic with a chief complaint of headache ongoing for 3 days. Reports he fell on some stairs Saturday night, and hit the back of his head. He did not lose consciousness, has had no nausea or vomiting, no weakness, dizziness, slurring of speech, unilateral weakness or numbness, or other symptoms. He does have a past history of headaches, states his current headache is similar to prior headaches. He is not on blood thinners, and has no known past medical history. Social history significant for smoking, one pack per day, and regular alcohol use, several drinks per day, declined to put a specific number on it.   The history is provided by the patient and a friend.    Past Medical History:  Diagnosis Date  . Pneumonia    History reviewed. No pertinent surgical history. History reviewed. No pertinent family history. Social History  Substance Use Topics  . Smoking status: Current Every Day Smoker    Packs/day: 1.00    Types: Cigarettes  . Smokeless tobacco: Never Used  . Alcohol use Yes    Review of Systems  Constitutional: Negative.   HENT: Negative.   Eyes: Negative.   Respiratory: Negative.   Cardiovascular: Negative.   Gastrointestinal: Negative.   Musculoskeletal: Negative.   Skin: Negative.   Neurological: Positive for headaches. Negative for dizziness, facial asymmetry, speech difficulty, light-headedness and numbness.    Allergies  Patient has no known allergies.  Home Medications   Prior to Admission medications   Medication Sig Start Date End Date Taking? Authorizing Provider  acetaminophen (TYLENOL) 325 MG tablet Take 2 tablets (650 mg total) by mouth every 6 (six)  hours as needed. 10/25/15   Carlene Coria, PA-C   Meds Ordered and Administered this Visit   Medications  ketorolac (TORADOL) 30 MG/ML injection 30 mg (30 mg Intramuscular Given 07/28/16 1306)    BP 131/83   Pulse 74   Resp 18   SpO2 100%  No data found.   Physical Exam  Constitutional: He is oriented to person, place, and time. He appears well-developed and well-nourished. No distress.  HENT:  Head: Normocephalic and atraumatic.  Right Ear: External ear normal.  Left Ear: External ear normal.  Eyes: Conjunctivae and EOM are normal. Pupils are equal, round, and reactive to light. Right eye exhibits no discharge. Left eye exhibits no discharge.  Neck: Normal range of motion. Neck supple.  Cardiovascular: Normal rate and regular rhythm.   Pulmonary/Chest: Effort normal and breath sounds normal. No respiratory distress.  Abdominal: Soft. Bowel sounds are normal.  Musculoskeletal: He exhibits no edema, tenderness or deformity.  Neurological: He is alert and oriented to person, place, and time. No cranial nerve deficit. He exhibits normal muscle tone. Coordination normal.  Skin: Skin is warm and dry. Capillary refill takes less than 2 seconds. He is not diaphoretic.  Psychiatric: He has a normal mood and affect. His behavior is normal.  Nursing note and vitals reviewed.   Urgent Care Course     Procedures (including critical care time)  Labs Review Labs Reviewed - No data to display  Imaging Review No results found.     MDM   1. Acute nonintractable headache, unspecified headache type    History  and physical do not point to any red flag warning signs with regard to headache, treating today with Toradol. Strict follow-up guidelines and counseling provided with regard to warning signs and symptoms that need immediate care. Recommend following up with primary care if symptoms persist, or go to the emergency room at any time symptoms worsen.     Dorena Bodo,  NP 07/28/16 1326

## 2016-07-28 NOTE — Discharge Instructions (Signed)
If your headache continues, or does not improve, or at anytime if it worsens, go to the emergency room. Other signs that warrant going to the emergency room, loss of consciousness, lethargy, slurred speech, inability to be awoken, one sided weakness or numbness, blurred vision, or loss of vision, all these symptoms need to be evaluated at the ER.

## 2016-07-30 ENCOUNTER — Encounter (HOSPITAL_COMMUNITY): Payer: Self-pay

## 2016-07-30 ENCOUNTER — Emergency Department (HOSPITAL_COMMUNITY)
Admission: EM | Admit: 2016-07-30 | Discharge: 2016-07-30 | Disposition: A | Discharge status: Home / Self Care | Payer: Self-pay | Attending: Emergency Medicine | Admitting: Emergency Medicine

## 2016-07-30 ENCOUNTER — Emergency Department (HOSPITAL_COMMUNITY): Payer: Self-pay

## 2016-07-30 DIAGNOSIS — F17210 Nicotine dependence, cigarettes, uncomplicated: Secondary | ICD-10-CM | POA: Insufficient documentation

## 2016-07-30 DIAGNOSIS — Y929 Unspecified place or not applicable: Secondary | ICD-10-CM | POA: Insufficient documentation

## 2016-07-30 DIAGNOSIS — Y999 Unspecified external cause status: Secondary | ICD-10-CM | POA: Insufficient documentation

## 2016-07-30 DIAGNOSIS — Z7982 Long term (current) use of aspirin: Secondary | ICD-10-CM | POA: Insufficient documentation

## 2016-07-30 DIAGNOSIS — W108XXA Fall (on) (from) other stairs and steps, initial encounter: Secondary | ICD-10-CM | POA: Insufficient documentation

## 2016-07-30 DIAGNOSIS — S0990XA Unspecified injury of head, initial encounter: Secondary | ICD-10-CM | POA: Insufficient documentation

## 2016-07-30 DIAGNOSIS — Y939 Activity, unspecified: Secondary | ICD-10-CM | POA: Insufficient documentation

## 2016-07-30 MED ORDER — DIPHENHYDRAMINE HCL 50 MG/ML IJ SOLN
25.0000 mg | Freq: Once | INTRAMUSCULAR | Status: AC
  Administered 2016-07-30: 25 mg via INTRAMUSCULAR
  Filled 2016-07-30: qty 1

## 2016-07-30 MED ORDER — PROCHLORPERAZINE EDISYLATE 5 MG/ML IJ SOLN
10.0000 mg | Freq: Once | INTRAMUSCULAR | Status: AC
  Administered 2016-07-30: 10 mg via INTRAMUSCULAR
  Filled 2016-07-30: qty 2
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
## 2016-07-30 NOTE — ED Notes (Signed)
Patient transported to CT 

## 2016-07-30 NOTE — ED Triage Notes (Signed)
Per Pt, Pt is coming from work. Pt reports hitting his head on the steps last Thursday after he tripped down the stairs. Pt reports that he did not lose consciousness. Reports going to UC on Monday to be assessed with no abnormalities. Today, pt reports vomiting and feeling dizzy while at work. Neuro exam negative. Neuro intact.

## 2017-03-19 IMAGING — CT CT HEAD W/O CM
3 of 4 series · 17 of 47 positions shown, 20 images · non-contrast
Comparison: 10/14/2015

CLINICAL DATA: Right side headache since this morning.

EXAM:
CT HEAD WITHOUT CONTRAST
TECHNIQUE: Contiguous axial images were obtained from the base of the skull
through the vertex without intravenous contrast.

[Series 201: head w/o, idose (1) · axial · non-contrast · 0.49mm/px · z∈[+110,+230]mm · 11 of 30 slices shown, 14 images]
[im 3/30  brain]
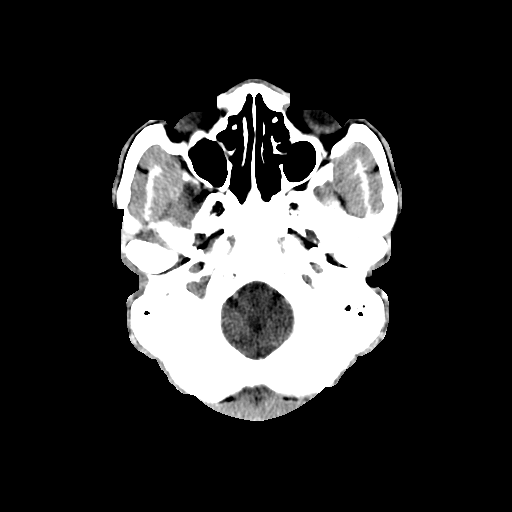
[im 3/30  bone]
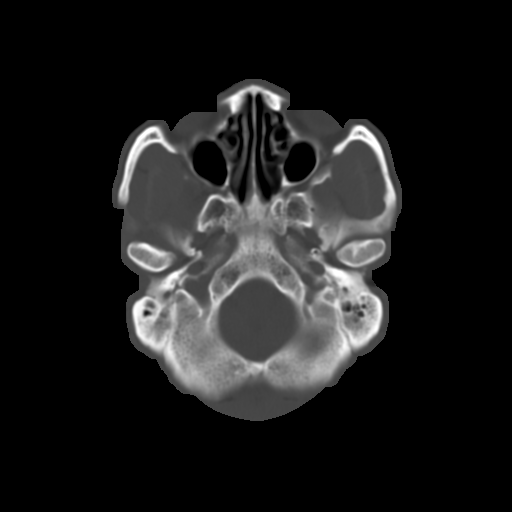
[im 5/30  brain]
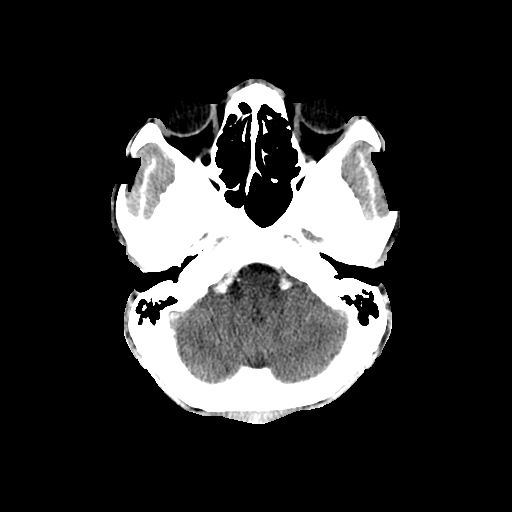
[im 7/30  brain]
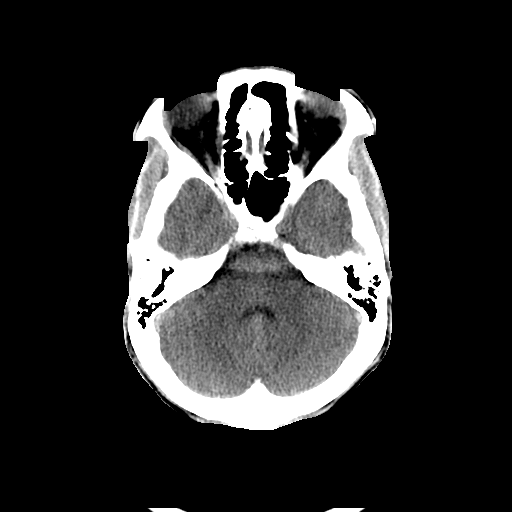
[im 11/30  brain]
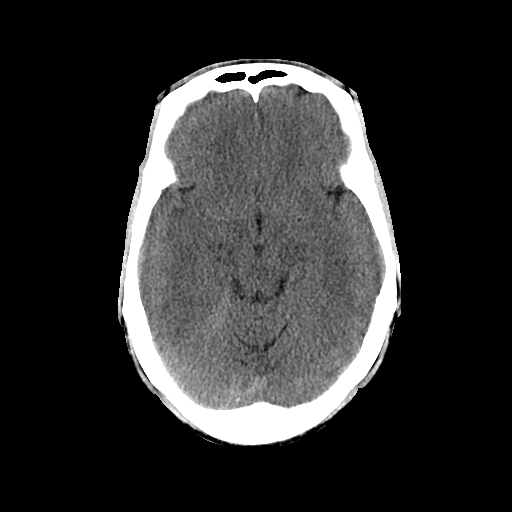
[im 13/30  brain]
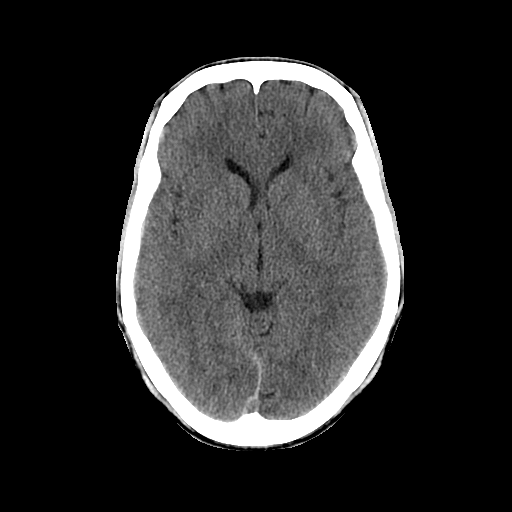
[im 13/30  bone]
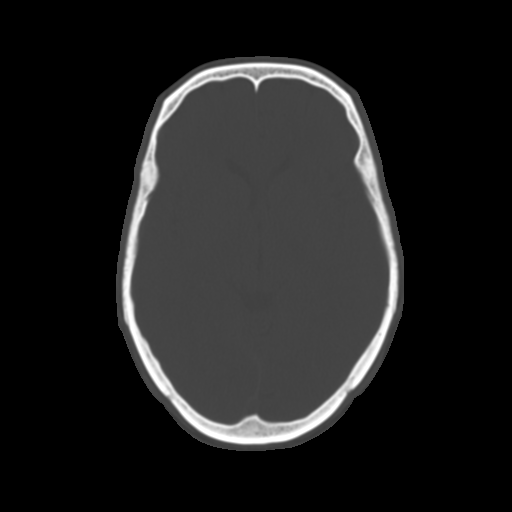
[im 15/30  brain]
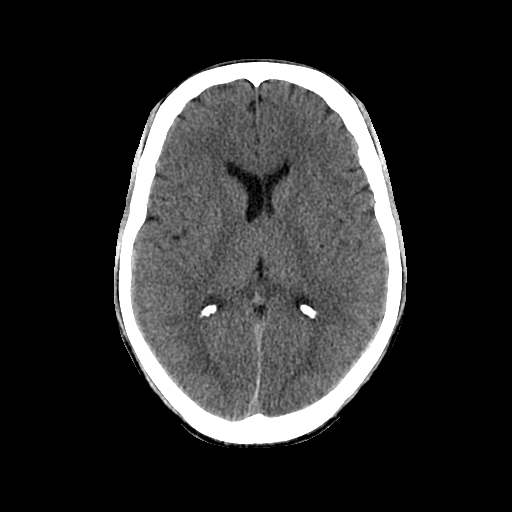
[im 17/30  brain]
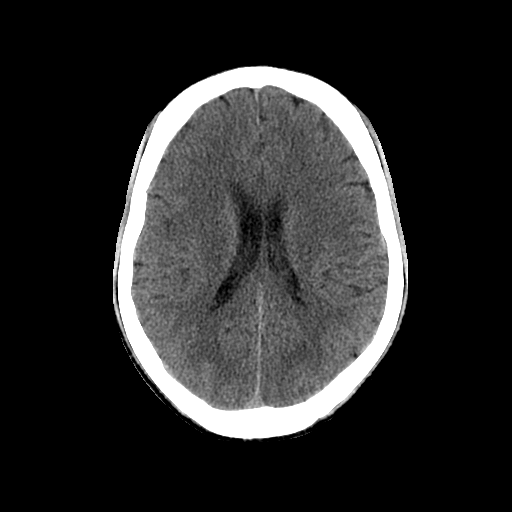
[im 19/30  brain]
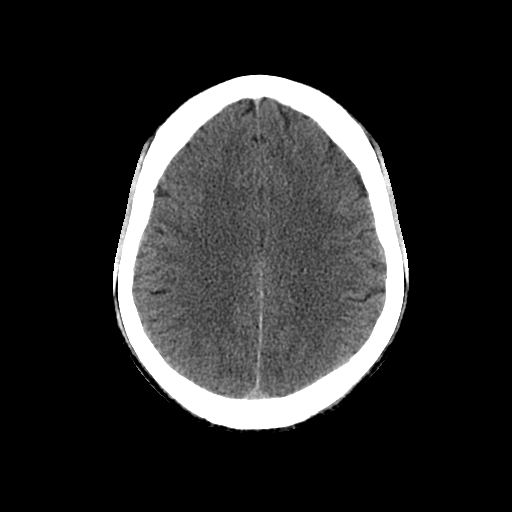
[im 23/30  brain]
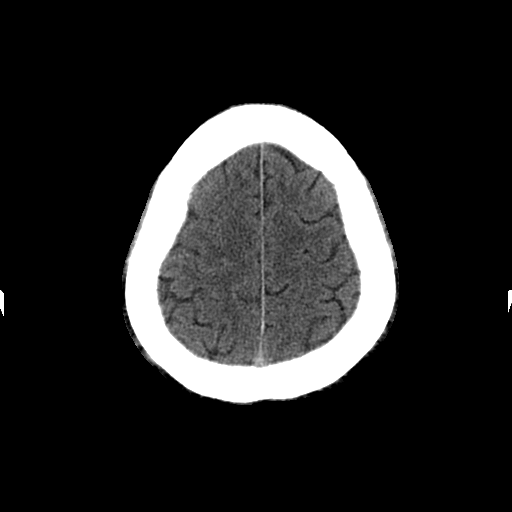
[im 23/30  bone]
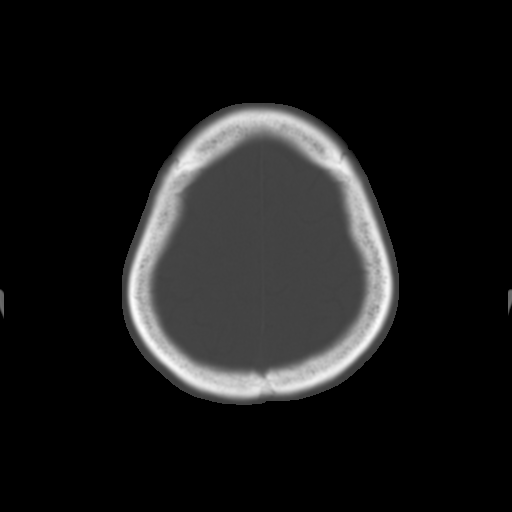
[im 25/30  brain]
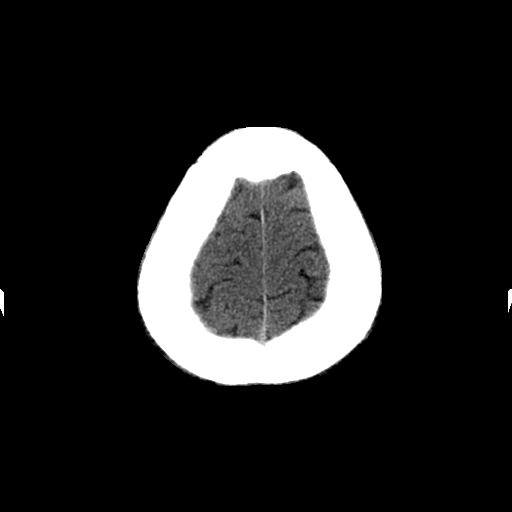
[im 27/30  brain]
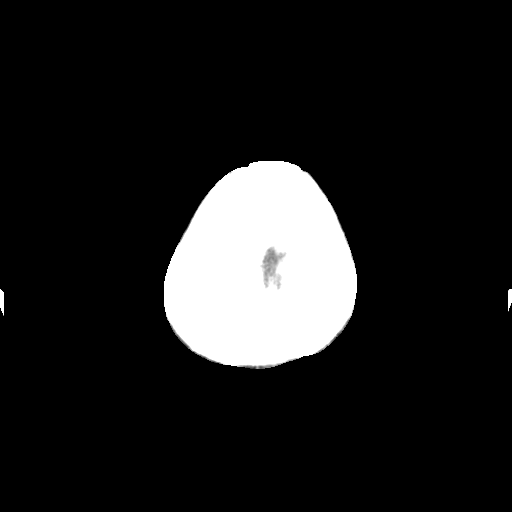

[Series 203: coronal st, idose (1) · coronal · 0.40mm/px · 3 of 74 slices shown]
[im 25/74  brain]
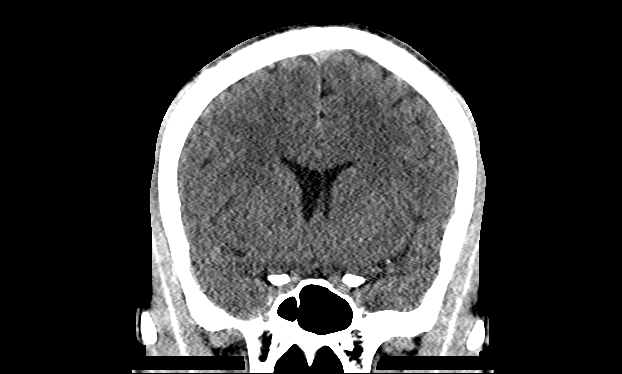
[im 33/74  brain]
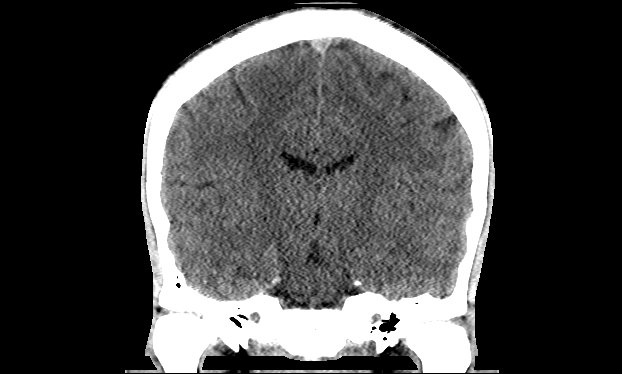
[im 41/74  brain]
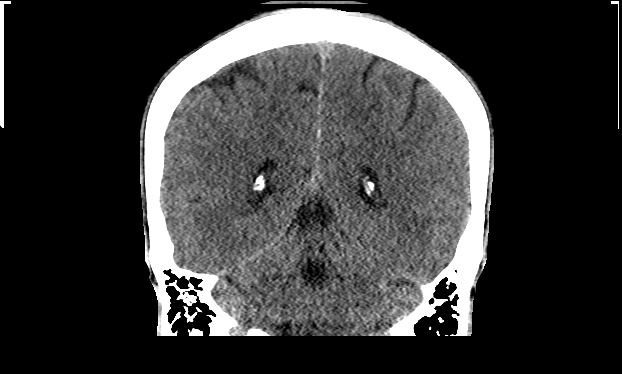

[Series 204: sagittal st, idose (1) · sagittal · 0.40mm/px · 3 of 83 slices shown]
[im 28/83  brain]
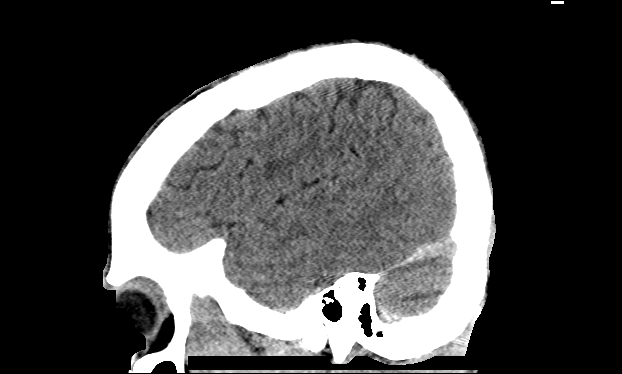
[im 42/83  brain]
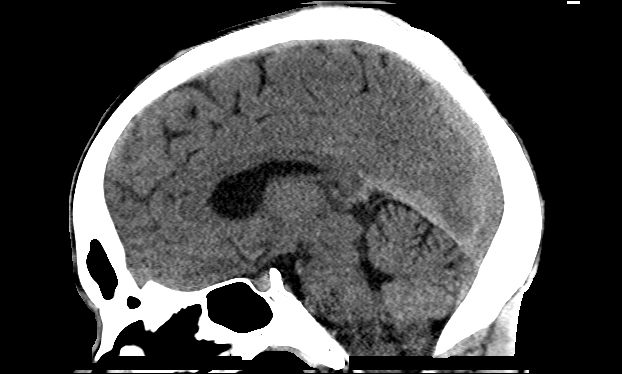
[im 55/83  brain]
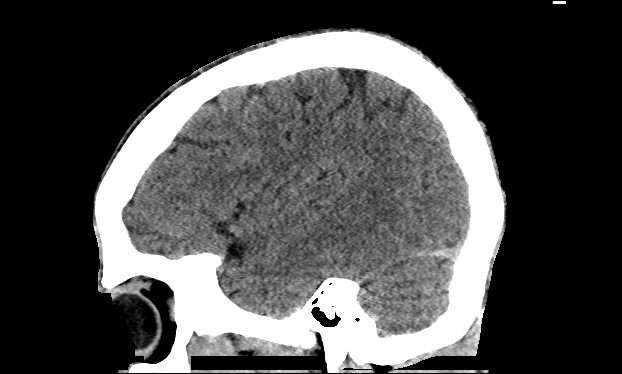

[17 of 47 positions shown; findings below may reference images not displayed]

FINDINGS: Previously seen small subdural hematoma along the falx and over the
right convexity no longer visualized. There is slight increased
density of the right tentorium which may represent small layering
subdural hematoma. This is likely similar prior study. No new
hemorrhage. No hydrocephalus or midline shift.
IMPRESSION: Improving subdural hemorrhage along the falx and right convexity.
Stable dense appearance of the right tentorium, likely small
subdural hematoma.

## 2018-03-08 ENCOUNTER — Emergency Department (HOSPITAL_COMMUNITY)
Admission: EM | Admit: 2018-03-08 | Discharge: 2018-03-08 | Disposition: A | Discharge status: Home / Self Care | Payer: Self-pay | Attending: Emergency Medicine | Admitting: Emergency Medicine

## 2018-03-08 ENCOUNTER — Emergency Department (HOSPITAL_COMMUNITY): Payer: Self-pay

## 2018-03-08 ENCOUNTER — Encounter (HOSPITAL_COMMUNITY): Payer: Self-pay | Admitting: *Deleted

## 2018-03-08 DIAGNOSIS — M5442 Lumbago with sciatica, left side: Secondary | ICD-10-CM | POA: Insufficient documentation

## 2018-03-08 DIAGNOSIS — F17210 Nicotine dependence, cigarettes, uncomplicated: Secondary | ICD-10-CM | POA: Insufficient documentation

## 2018-03-08 MED ORDER — METHOCARBAMOL 500 MG PO TABS: 500.0000 mg | ORAL_TABLET | Freq: Two times a day (BID) | ORAL | 0 refills | Status: DC

## 2018-03-08 MED ORDER — PREDNISONE 20 MG PO TABS
60.0000 mg | ORAL_TABLET | Freq: Once | ORAL | Status: AC
  Administered 2018-03-08: 60 mg via ORAL
  Filled 2018-03-08: qty 3

## 2018-03-08 MED ORDER — KETOROLAC TROMETHAMINE 60 MG/2ML IM SOLN
60.0000 mg | Freq: Once | INTRAMUSCULAR | Status: AC
  Administered 2018-03-08: 60 mg via INTRAMUSCULAR
  Filled 2018-03-08: qty 2

## 2018-03-08 MED ORDER — LIDOCAINE 5 % EX PTCH: 1.0000 | MEDICATED_PATCH | CUTANEOUS | 0 refills | Status: DC

## 2018-03-08 MED ORDER — PREDNISONE 20 MG PO TABS: 40.0000 mg | ORAL_TABLET | Freq: Every day | ORAL | 0 refills | Status: AC

## 2018-03-08 MED ORDER — DIAZEPAM 5 MG/ML IJ SOLN
5.0000 mg | Freq: Once | INTRAMUSCULAR | Status: AC
  Administered 2018-03-08: 5 mg via INTRAMUSCULAR
  Filled 2018-03-08: qty 2

## 2018-03-08 NOTE — ED Provider Notes (Signed)
[facility] EMERGENCY DEPARTMENT Provider Note   CSN: [id number] Arrival date & time: 03/08/18  1511     History   Chief Complaint Chief Complaint  Patient presents with  . Back Pain    HPI Marcus Dennis is a 37 y.o. male who presents for evaluation of lower back pain, left greater than right that is been ongoing for the last week.  Patient reports that he injured it at work but does not recall a specific injury.  He does not think he fell.  He does state that he does heavy lifting at work.  He states that the pain radiates into the posterior aspect of his left lower extremity.  He states he has had some tingling sensation in the left toes since the pain began.  He still has been able to walk but does report that walking worsens his pain.  He has been taking ibuprofen with no improvement in symptoms. Denies fevers, weight loss, numbness/weakness of upper and lower extremities, bowel/bladder incontinence, saddle anesthesia, history of back surgery, history of IVDA.   The history is provided by the patient.    Past Medical History:  Diagnosis Date  . Pneumonia     Patient Active Problem List   Diagnosis Date Noted  . Pneumonia     History reviewed. No pertinent surgical history.      Home Medications    Prior to Admission medications   Medication Sig Start Date End Date Taking? Authorizing Provider  acetaminophen (TYLENOL) 325 MG tablet Take 2 tablets (650 mg total) by mouth every 6 (six) hours as needed. 10/25/15   Sam, Ace Gins, PA-C  Aspirin-Salicylamide-Caffeine (BC HEADACHE POWDER PO) Take 1 packet by mouth daily as needed (pain).    [provider]  lidocaine (LIDODERM) 5 % Place 1 patch onto the skin daily. Remove & Discard patch within 12 hours or as directed by MD 03/08/18   Maxwell Caul, PA-C  methocarbamol (ROBAXIN) 500 MG tablet Take 1 tablet (500 mg total) by mouth 2 (two) times daily. 03/08/18   Maxwell Caul, PA-C    predniSONE (DELTASONE) 20 MG tablet Take 2 tablets (40 mg total) by mouth daily for 4 days. 03/08/18 03/12/18  Maxwell Caul, PA-C    Family History History reviewed. No pertinent family history.  Social History Social History   Tobacco Use  . Smoking status: Current Every Day Smoker    Packs/day: 1.00    Types: Cigarettes  . Smokeless tobacco: Never Used  Substance Use Topics  . Alcohol use: Yes  . Drug use: No     Allergies   Patient has no known allergies.   Review of Systems Review of Systems  Constitutional: Negative for fever.  Genitourinary: Negative for dysuria and hematuria.  Musculoskeletal: Positive for back pain.  Neurological: Positive for numbness. Negative for weakness and headaches.  All other systems reviewed and are negative.   Physical Exam Updated Vital Signs BP 109/81   Pulse 86   Temp 98.5 F (36.9 C) (Oral)   Resp 16   SpO2 99%   Physical Exam Vitals signs and nursing note reviewed.  Constitutional:      Appearance: He is well-developed.  HENT:     Head: Normocephalic and atraumatic.  Eyes:     General: No scleral icterus.       Right eye: No discharge.        Left eye: No discharge.     Conjunctiva/sclera: Conjunctivae normal.  Neck:     Musculoskeletal: Full passive range of motion without pain.     Comments: Full flexion/extension and lateral movement of neck fully intact. No bony midline tenderness. No deformities or crepitus.  Pulmonary:     Effort: Pulmonary effort is normal.  Musculoskeletal:     Thoracic back: He exhibits no tenderness.       Back:     Comments: No tenderness noted to midline thoracic spine.  No deformity crepitus noted.  Diffuse tenderness noted to the entire lower lumbar region.   Skin:    General: Skin is warm and dry.  Neurological:     Mental Status: He is alert.     Deep Tendon Reflexes:     Reflex Scores:      Patellar reflexes are 2+ on the right side and 2+ on the left side.     Comments: 5/5 strength BUE 5/5 strength RLE Limited strength of left lower extremity secondary to pain.  Patient is able to lift it and move it but has pain that radiates down the posterior aspect of his leg. Decreased sensation noted to the left first and second toe.  Sensation fully intact in all other major nerve dermatome distributions, including other aspects of L4.  Psychiatric:        Speech: Speech normal.        Behavior: Behavior normal.     ED Treatments / Results  Labs (all labs ordered are listed, but only abnormal results are displayed) Labs Reviewed - No data to display  EKG None  Radiology No results found.  Procedures Procedures (including critical care time)  Medications Ordered in ED Medications  predniSONE (DELTASONE) tablet 60 mg (60 mg Oral Given 03/08/18 1613)  ketorolac (TORADOL) injection 60 mg (60 mg Intramuscular Given 03/08/18 1614)  diazepam (VALIUM) injection 5 mg (5 mg Intramuscular Given 03/08/18 1616)     Initial Impression / Assessment and Plan / ED Course  I have reviewed the triage vital signs and the nursing notes.  Pertinent labs & imaging results that were available during my care of the patient were reviewed by me and considered in my medical decision making (see chart for details).     37 year old male who presents for evaluation of back pain that is been ongoing for about a week.  Reports it started after work.  He does endorse heavy lifting at work.  No history of trauma, injury, fall. Patient is afebrile, non-toxic appearing, sitting comfortably on examination table. Vital signs reviewed and stable.  No red flag symptoms noted.  On exam, he has diffuse tenderness noted to the lower lumbar region.  He has difficulty moving the left lower extremity secondary to pain.  He states he is not having weakness of the leg.  He is able to walk but does report pain when he walks.  He has some numbness noted to the first and second toes of the  left foot but it does not extend up with the rest the distribution of the L4.  Consider musculoskeletal pain versus sciatica.  History/physical exam not concerning for cauda equina, spinal abscess.  Will plan for x-ray given questionable injury.  Patient given analgesics.  Reviewed.  Negative for any acute bony abnormality.  Normal alignment.  Reevaluation.  He reports improvement in pain after analgesics.  He is able to get up by himself and walk around the room.  He still reports some pain in the lower back but states it is improved.  He is able to lift his leg much easier after analgesics.  I discussed with patient regarding his symptoms.  At this time, do not suspect acute cauda equina or spinal cord injury.  Patient symptoms most likely either due to a sciatica versus nerve impingement.  Possibly bulging disc.  I discussed with patient that we could try treating this outpatient with medications and have neurosurgery follow-up.  Additionally, I discussed with patient that given the amount of pain, we could do an MRI for further information regarding his symptoms.  Engaged in shared decision-making with the patient.  He states he does not want to stay tonight for an MRI and that he would rather try medications and follow-up with neurosurgery on an outpatient basis.  Patient exhibits full medical decision-making capacity. At this time, patient exhibits no emergent life-threatening condition that require further evaluation in ED. Patient had ample opportunity for questions and discussion. All patient's questions were answered with full understanding. Strict return precautions discussed. Patient expresses understanding and agreement to plan.    Final Clinical Impressions(s) / ED Diagnoses   Final diagnoses:  Acute bilateral low back pain with left-sided sciatica    ED Discharge Orders         Ordered    methocarbamol (ROBAXIN) 500 MG tablet  2 times daily     03/08/18 1813    lidocaine (LIDODERM) 5  %  Every 24 hours     03/08/18 1813    predniSONE (DELTASONE) 20 MG tablet  Daily     03/08/18 1813           Rosana Hoes 03/11/18 2014    Mancel Bale, MD 03/15/18 1258

## 2018-03-08 NOTE — ED Triage Notes (Signed)
Pt in c/o continued back pain since injury at work earlier in the week, pain is worse with movement, no relief with home medicaitons

## 2018-03-08 NOTE — Discharge Instructions (Signed)
You can take Tylenol or Ibuprofen as directed for pain. You can alternate Tylenol and Ibuprofen every 4 hours. If you take Tylenol at 1pm, then you can take Ibuprofen at 5pm. Then you can take Tylenol again at 9pm.   Take Robaxin as prescribed. This medication will make you drowsy so do not drive or drink alcohol when taking it.  Take prednisone as directed.   Use Lidocaine patches as directed.   Follow up with the referred neurosurgeon's office.   Return to the Emergency Department immediately for any worsening back pain, neck pain, difficulty walking, numbness/weaknss of your arms or legs, urinary or bowel accidents, fever or any other worsening or concerning symptoms.

## 2018-07-12 ENCOUNTER — Emergency Department (HOSPITAL_COMMUNITY)
Admission: EM | Admit: 2018-07-12 | Discharge: 2018-07-12 | Payer: Self-pay | Attending: Emergency Medicine | Admitting: Emergency Medicine

## 2018-07-12 ENCOUNTER — Emergency Department (HOSPITAL_COMMUNITY): Payer: Self-pay

## 2018-07-12 ENCOUNTER — Other Ambulatory Visit: Payer: Self-pay

## 2018-07-12 ENCOUNTER — Encounter (HOSPITAL_COMMUNITY): Payer: Self-pay | Admitting: Emergency Medicine

## 2018-07-12 DIAGNOSIS — T07XXXA Unspecified multiple injuries, initial encounter: Secondary | ICD-10-CM

## 2018-07-12 DIAGNOSIS — Y92008 Other place in unspecified non-institutional (private) residence as the place of occurrence of the external cause: Secondary | ICD-10-CM | POA: Insufficient documentation

## 2018-07-12 DIAGNOSIS — F17210 Nicotine dependence, cigarettes, uncomplicated: Secondary | ICD-10-CM | POA: Insufficient documentation

## 2018-07-12 DIAGNOSIS — W25XXXA Contact with sharp glass, initial encounter: Secondary | ICD-10-CM | POA: Insufficient documentation

## 2018-07-12 DIAGNOSIS — Z23 Encounter for immunization: Secondary | ICD-10-CM | POA: Insufficient documentation

## 2018-07-12 DIAGNOSIS — R51 Headache: Secondary | ICD-10-CM | POA: Insufficient documentation

## 2018-07-12 DIAGNOSIS — F10920 Alcohol use, unspecified with intoxication, uncomplicated: Secondary | ICD-10-CM | POA: Insufficient documentation

## 2018-07-12 DIAGNOSIS — Y999 Unspecified external cause status: Secondary | ICD-10-CM | POA: Insufficient documentation

## 2018-07-12 DIAGNOSIS — S51012A Laceration without foreign body of left elbow, initial encounter: Secondary | ICD-10-CM | POA: Insufficient documentation

## 2018-07-12 DIAGNOSIS — Y9389 Activity, other specified: Secondary | ICD-10-CM | POA: Insufficient documentation

## 2018-07-12 DIAGNOSIS — S51811A Laceration without foreign body of right forearm, initial encounter: Secondary | ICD-10-CM | POA: Insufficient documentation

## 2018-07-12 DIAGNOSIS — S61511A Laceration without foreign body of right wrist, initial encounter: Secondary | ICD-10-CM | POA: Insufficient documentation

## 2018-07-12 MED ORDER — LIDOCAINE-EPINEPHRINE (PF) 2 %-1:200000 IJ SOLN
20.0000 mL | Freq: Once | INTRAMUSCULAR | Status: AC
  Administered 2018-07-12: 20 mL via INTRADERMAL
  Filled 2018-07-12: qty 20

## 2018-07-12 MED ORDER — TETANUS-DIPHTH-ACELL PERTUSSIS 5-2.5-18.5 LF-MCG/0.5 IM SUSP
0.5000 mL | Freq: Once | INTRAMUSCULAR | Status: AC
  Administered 2018-07-12: 01:00:00 0.5 mL via INTRAMUSCULAR
  Filled 2018-07-12: qty 0.5
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
## 2018-07-12 NOTE — ED Notes (Signed)
ED Provider at bedside. 

## 2018-07-12 NOTE — ED Notes (Signed)
Patient transported to X-ray 

## 2018-07-12 NOTE — Discharge Instructions (Signed)
You may alternate Tylenol 1000 mg every 6 hours as needed for pain and Ibuprofen 800 mg every 8 hours as needed for pain.  Please take Ibuprofen with food. ° °

## 2018-07-12 NOTE — ED Notes (Signed)
Pt given ice water and [employer] to drink. Pt more awake now. Will attempt to ambulate soon.

## 2018-07-12 NOTE — ED Notes (Signed)
Reviewed d/c instructions with pt, who verbalized understanding and had no outstanding questions. [employer] & pt labels removed and disposed of in shred bin. Pt departed in NAD, and in custody of [employer]. Pt's shoes and cell phone remained in exam room with pt throughout stay in ED.

## 2018-09-20 ENCOUNTER — Emergency Department (HOSPITAL_COMMUNITY)
Admission: EM | Admit: 2018-09-20 | Discharge: 2018-09-21 | Discharge status: Against Medical Advice | Payer: Self-pay | Attending: Emergency Medicine | Admitting: Emergency Medicine

## 2018-09-20 DIAGNOSIS — Z5321 Procedure and treatment not carried out due to patient leaving prior to being seen by health care provider: Secondary | ICD-10-CM | POA: Insufficient documentation

## 2018-09-21 ENCOUNTER — Encounter (HOSPITAL_COMMUNITY): Payer: Self-pay | Admitting: Emergency Medicine

## 2018-09-21 ENCOUNTER — Other Ambulatory Visit: Payer: Self-pay

## 2018-09-21 NOTE — ED Triage Notes (Signed)
Pt c/o low back pain x's 2 days.  Pt st's he has had back problems for several years.  No known injury.  Pt st's it is causing him to lose his job

## 2018-09-21 NOTE — ED Notes (Signed)
Pt seen leaving in a car from ED.

## 2019-09-12 ENCOUNTER — Encounter (HOSPITAL_COMMUNITY): Payer: Self-pay | Admitting: Emergency Medicine

## 2019-09-12 ENCOUNTER — Emergency Department (HOSPITAL_COMMUNITY)
Admission: EM | Admit: 2019-09-12 | Discharge: 2019-09-12 | Disposition: A | Discharge status: Home / Self Care | Payer: Self-pay | Attending: Emergency Medicine | Admitting: Emergency Medicine

## 2019-09-12 DIAGNOSIS — M5442 Lumbago with sciatica, left side: Secondary | ICD-10-CM | POA: Insufficient documentation

## 2019-09-12 DIAGNOSIS — F17210 Nicotine dependence, cigarettes, uncomplicated: Secondary | ICD-10-CM | POA: Insufficient documentation

## 2019-09-12 DIAGNOSIS — M5440 Lumbago with sciatica, unspecified side: Secondary | ICD-10-CM

## 2019-09-12 MED ORDER — KETOROLAC TROMETHAMINE 15 MG/ML IJ SOLN
15.0000 mg | Freq: Once | INTRAMUSCULAR | Status: AC
  Administered 2019-09-12: 15 mg via INTRAMUSCULAR
  Filled 2019-09-12: qty 1

## 2019-09-12 MED ORDER — DIAZEPAM 5 MG PO TABS
5.0000 mg | ORAL_TABLET | Freq: Once | ORAL | Status: AC
  Administered 2019-09-12: 5 mg via ORAL
  Filled 2019-09-12: qty 1

## 2019-09-12 MED ORDER — PREDNISONE 20 MG PO TABS
60.0000 mg | ORAL_TABLET | Freq: Once | ORAL | Status: AC
  Administered 2019-09-12: 60 mg via ORAL
  Filled 2019-09-12: qty 3

## 2019-09-12 MED ORDER — METHOCARBAMOL 500 MG PO TABS: 500.0000 mg | ORAL_TABLET | Freq: Two times a day (BID) | ORAL | 0 refills | Status: AC

## 2019-09-12 NOTE — ED Provider Notes (Signed)
[facility] Provider Note   CSN: [id number] Arrival date & time: 09/12/19  0859     History Chief Complaint  Patient presents with  . Back Pain    Marcus Dennis is a 39 y.o. male.  39 y.o male with a PMH of sciatica presents to the ED with a chief complaint of lower back pain x 2 days ago. Patient describes a sharp constant pain to the left lumbar spine with radiation down his leg. 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  Marcus Dennis does report wearing a back brace at home, however thus a good amount of heavy lifting while working.  Marcus Dennis has not taken any medication for improvement in symptoms.  Pain is exacerbated with movement and bending at the waist.  No prior history of IV drug use, fever, saddle anesthesia or urinary symptoms.  The history is provided by the patient.  Back Pain Associated symptoms: no chest pain and no fever        Past Medical History:  Diagnosis Date  . Pneumonia     Patient Active Problem List   Diagnosis Date Noted  . Pneumonia     History reviewed. No pertinent surgical history.     No family history on file.  Social History   Tobacco Use  . Smoking status: Current Every Day Smoker    Packs/day: 1.00    Types: Cigarettes  . Smokeless tobacco: Never Used  Substance Use Topics  . Alcohol use: Yes  . Drug use: No    Home Medications Prior to Admission medications   Medication Sig Start Date End Date Taking? Authorizing Provider  acetaminophen (TYLENOL) 325 MG tablet Take 2 tablets (650 mg total) by mouth every 6 (six) hours as needed. 10/25/15   Sam, Olivia Canter, PA-C  Aspirin-Salicylamide-Caffeine (BC HEADACHE POWDER PO) Take 1 packet by mouth daily as needed (pain).    [provider]  lidocaine (LIDODERM) 5 % Place 1 patch onto the skin daily. Remove & Discard patch within 12 hours or as directed by MD 03/08/18   Volanda Napoleon, PA-C  methocarbamol  (ROBAXIN) 500 MG tablet Take 1 tablet (500 mg total) by mouth 2 (two) times daily. 03/08/18   Volanda Napoleon, PA-C    Allergies    Patient has no known allergies.  Review of Systems   Review of Systems  Constitutional: Negative for chills and fever.  Respiratory: Negative for shortness of breath.   Cardiovascular: Negative for chest pain.  Musculoskeletal: Positive for back pain. Negative for myalgias.    Physical Exam Updated Vital Signs BP 132/78   Pulse 75   Temp 98.4 F (36.9 C)   Resp 16   Ht 5\' 6"  (1.676 m)   Wt 68 kg   SpO2 97%   BMI 24.21 kg/m   Physical Exam Vitals and nursing note reviewed.  Constitutional:      Appearance: Normal appearance.  HENT:     Head: Normocephalic and atraumatic.     Mouth/Throat:     Mouth: Mucous membranes are moist.  Cardiovascular:     Rate and Rhythm: Normal rate.  Pulmonary:     Effort: Pulmonary effort is normal.  Abdominal:     General: Abdomen is flat.  Musculoskeletal:     Cervical back: Normal range of motion and neck supple.     Lumbar back: Spasms and tenderness present. No bony tenderness. Normal range of motion. Positive left straight  leg raise test.     Comments: RLE- KF,KE 5/5 strength LLE- HF, Marcus Dennis 5/5 strength Normal gait. No pronator drift. No leg drop.  CN I, II and VIII not tested. CN II-XII grossly intact bilaterally.     Skin:    General: Skin is warm and dry.  Neurological:     Mental Status: Marcus Dennis is alert and oriented to person, place, and time.     ED Results / Procedures / Treatments   Labs (all labs ordered are listed, but only abnormal results are displayed) Labs Reviewed - No data to display  EKG None  Radiology No results found.  Procedures Procedures (including critical care time)  Medications Ordered in ED Medications  diazepam (VALIUM) tablet 5 mg (has no administration in time range)  ketorolac (TORADOL) 15 MG/ML injection 15 mg (has no administration in time range)    predniSONE (DELTASONE) tablet 60 mg (has no administration in time range)    ED Course  I have reviewed the triage vital signs and the nursing notes.  Pertinent labs & imaging results that were available during my care of the patient were reviewed by me and considered in my medical decision making (see chart for details).    MDM Rules/Calculators/A&P   Patient with an ongoing history of sciatica presents to the ED with a chief complaint of low back pain for the past 2 days.  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  Marcus Dennis has not tried any medication for improvement in symptoms.  Previous visits reviewed on his chart, Marcus Dennis does have multiple visits for the same complaint.  Reports Marcus Dennis continues to work as a Museum/gallery exhibitions officer, does a good amount of heavy lifting, pain is exacerbated with movement.  Marcus Dennis denies any saddle anesthesia, prior history of IV drug use, urinary symptoms.  No trauma.  Patient was wheeled into the ED via wheelchair, appears uncomfortable however nonill and nontoxic.  Vitals are within normal limits, Marcus Dennis is afebrile.  Extensive chart review from previous visits, last visit on December 2019, lumbar spine x-ray was obtained which did not show any acute pathology.  Marcus Dennis denies any trauma on today's visit.  We will provide him with pain control and reevaluation.    Portions of this note were generated with Scientist, clinical (histocompatibility and immunogenetics). Dictation errors may occur despite best attempts at proofreading.  Final Clinical Impression(s) / ED Diagnoses Final diagnoses:  Acute left-sided low back pain with sciatica, sciatica laterality unspecified    Rx / DC Orders ED Discharge Orders    None       Claude Manges, PA-C 09/15/19 1791    Gerhard Munch, MD 09/19/19 1700

## 2019-09-12 NOTE — ED Provider Notes (Signed)
[facility] Provider Note   CSN: [id number] Arrival date & time: 09/12/19  0859     History Chief Complaint  Patient presents with  . Back Pain    Marcus Dennis is a 39 y.o. male.  39 y.o male with a PMH of sciatica presents to the ED with a chief complaint of lower back pain x 2 days ago. Patient describes a sharp constant pain to the left lumbar spine with radiation down his leg. 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  He does report wearing a back brace at home, however thus a good amount of heavy lifting while working.  He has not taken any medication for improvement in symptoms.  Pain is exacerbated with movement and bending at the waist.  No prior history of IV drug use, fever, saddle anesthesia or urinary symptoms.  The history is provided by the patient.  Back Pain Associated symptoms: no chest pain and no fever        Past Medical History:  Diagnosis Date  . Pneumonia     Patient Active Problem List   Diagnosis Date Noted  . Pneumonia     History reviewed. No pertinent surgical history.     No family history on file.  Social History   Tobacco Use  . Smoking status: Current Every Day Smoker    Packs/day: 1.00    Types: Cigarettes  . Smokeless tobacco: Never Used  Substance Use Topics  . Alcohol use: Yes  . Drug use: No    Home Medications Prior to Admission medications   Medication Sig Start Date End Date Taking? Authorizing Provider  acetaminophen (TYLENOL) 325 MG tablet Take 2 tablets (650 mg total) by mouth every 6 (six) hours as needed. 10/25/15   Sam, Olivia Canter, PA-C  Aspirin-Salicylamide-Caffeine (BC HEADACHE POWDER PO) Take 1 packet by mouth daily as needed (pain).    [provider]  lidocaine (LIDODERM) 5 % Place 1 patch onto the skin daily. Remove & Discard patch within 12 hours or as directed by MD 03/08/18   Volanda Napoleon, PA-C  methocarbamol  (ROBAXIN) 500 MG tablet Take 1 tablet (500 mg total) by mouth 2 (two) times daily for 7 days. 09/12/19 09/19/19  Janeece Fitting, PA-C    Allergies    Patient has no known allergies.  Review of Systems   Review of Systems  Constitutional: Negative for chills and fever.  Respiratory: Negative for shortness of breath.   Cardiovascular: Negative for chest pain.  Musculoskeletal: Positive for back pain. Negative for myalgias.    Physical Exam Updated Vital Signs BP 132/78   Pulse 75   Temp 98.4 F (36.9 C)   Resp 16   Ht 5\' 6"  (1.676 m)   Wt 68 kg   SpO2 97%   BMI 24.21 kg/m   Physical Exam Vitals and nursing note reviewed.  Constitutional:      Appearance: Normal appearance.  HENT:     Head: Normocephalic and atraumatic.     Mouth/Throat:     Mouth: Mucous membranes are moist.  Cardiovascular:     Rate and Rhythm: Normal rate.  Pulmonary:     Effort: Pulmonary effort is normal.  Abdominal:     General: Abdomen is flat.  Musculoskeletal:     Cervical back: Normal range of motion and neck supple.     Lumbar back: Spasms and tenderness present. No bony tenderness. Normal range of motion. Positive  left straight leg raise test.     Comments: RLE- KF,KE 5/5 strength LLE- HF, HE 5/5 strength Normal gait. No pronator drift. No leg drop.  CN I, II and VIII not tested. CN II-XII grossly intact bilaterally.     Skin:    General: Skin is warm and dry.  Neurological:     Mental Status: He is alert and oriented to person, place, and time.     ED Results / Procedures / Treatments   Labs (all labs ordered are listed, but only abnormal results are displayed) Labs Reviewed - No data to display  EKG None  Radiology No results found.  Procedures Procedures (including critical care time)  Medications Ordered in ED Medications  diazepam (VALIUM) tablet 5 mg (5 mg Oral Given 09/12/19 1208)  ketorolac (TORADOL) 15 MG/ML injection 15 mg (15 mg Intramuscular Given 09/12/19 1208)   predniSONE (DELTASONE) tablet 60 mg (60 mg Oral Given 09/12/19 1207)    ED Course  I have reviewed the triage vital signs and the nursing notes.  Pertinent labs & imaging results that were available during my care of the patient were reviewed by me and considered in my medical decision making (see chart for details).    MDM Rules/Calculators/A&P   Patient with an ongoing history of sciatica presents to the ED with a chief complaint of low back pain for the past 2 days.  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  He has not tried any medication for improvement in symptoms.  Previous visits reviewed on his chart, he does have multiple visits for the same complaint.  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  He denies any saddle anesthesia, prior history of IV drug use, urinary symptoms.  No trauma.  Patient was wheeled into the ED via wheelchair, appears uncomfortable however nonill and nontoxic.  Vitals are within normal limits, he is afebrile.  Extensive chart review from previous visits, last visit on December 2019, lumbar spine x-ray was obtained which did not show any acute pathology.  He denies any trauma on today's visit.  We will provide him with pain control and reevaluation.  12:32 PM patient reevaluated with improvement in his symptoms, we discussed symptomatic treatment along with a back brace to help with symptoms while at work.  Patient states and agrees to management, no red flags associated with his back pain.  Patient stable for discharge.  Portions of this note were generated with Scientist, clinical (histocompatibility and immunogenetics). Dictation errors may occur despite best attempts at proofreading.  Final Clinical Impression(s) / ED Diagnoses Final diagnoses:  Acute left-sided low back pain with sciatica, sciatica laterality unspecified    Rx / DC Orders ED Discharge Orders          Ordered    methocarbamol (ROBAXIN) 500 MG tablet  2 times daily     Discontinue  Reprint     09/12/19 1230           Claude Manges, PA-C 09/12/19 1233    Gerhard Munch, MD 09/13/19 1606

## 2019-09-12 NOTE — ED Notes (Signed)
Patient Alert and oriented to baseline. Stable and ambulatory to baseline. Patient verbalized understanding of the discharge instructions.  Patient belongings were taken by the patient.   

## 2019-09-12 NOTE — ED Triage Notes (Signed)
Pt. Stated, Im having back pain since yesterday.

## 2019-09-12 NOTE — Discharge Instructions (Addendum)
I have prescribed muscle relaxers for your pain, please do not drink or drive while taking this medications as they can make you drowsy.    Follow-up with PCP in 1 week for reevaluation of your symptoms.  You experience any bowel or bladder incontinence, fever, worsening in your symptoms please return to the ED.

## 2019-11-27 ENCOUNTER — Encounter (HOSPITAL_COMMUNITY): Payer: Self-pay

## 2019-11-27 ENCOUNTER — Other Ambulatory Visit: Payer: Self-pay

## 2019-11-27 ENCOUNTER — Ambulatory Visit (INDEPENDENT_AMBULATORY_CARE_PROVIDER_SITE_OTHER): Payer: Self-pay

## 2019-11-27 ENCOUNTER — Ambulatory Visit (HOSPITAL_COMMUNITY)
Admission: EM | Admit: 2019-11-27 | Discharge: 2019-11-27 | Disposition: A | Discharge status: Home / Self Care | Payer: Self-pay | Attending: Family Medicine | Admitting: Family Medicine

## 2019-11-27 DIAGNOSIS — M545 Low back pain, unspecified: Secondary | ICD-10-CM

## 2019-11-27 MED ORDER — TIZANIDINE HCL 4 MG PO TABS: 4.0000 mg | ORAL_TABLET | Freq: Four times a day (QID) | ORAL | 0 refills | Status: AC | PRN

## 2019-11-27 MED ORDER — MORPHINE SULFATE (PF) 4 MG/ML IV SOLN
2.0000 mg | Freq: Once | INTRAVENOUS | Status: AC
  Administered 2019-11-27: 2 mg via INTRAMUSCULAR

## 2019-11-27 MED ORDER — IBUPROFEN 800 MG PO TABS: 800.0000 mg | ORAL_TABLET | Freq: Three times a day (TID) | ORAL | 0 refills | Status: DC

## 2019-11-27 MED ORDER — MORPHINE SULFATE (PF) 4 MG/ML IV SOLN
INTRAVENOUS | Status: AC
  Filled 2019-11-27: qty 1

## 2019-11-27 MED ORDER — HYDROCODONE-ACETAMINOPHEN 7.5-325 MG PO TABS: 1.0000 | ORAL_TABLET | Freq: Four times a day (QID) | ORAL | 0 refills | Status: DC | PRN

## 2019-11-27 MED ORDER — MORPHINE SULFATE (PF) 2 MG/ML IV SOLN: 2.0000 mg | Freq: Once | INTRAVENOUS | Status: DC

## 2019-11-27 NOTE — ED Provider Notes (Signed)
[facility]    CSN: [id number] Arrival date & time: 11/27/19  1517      History   Chief Complaint Chief Complaint  Patient presents with  . Back Pain    HPI Marcus Dennis is a 39 y.o. male.   HPI  Patient has had multiple visits for back pain.  Currently states he has had back pain since last night at work.  States that he was lifting a tire that was very heavy, and felt a pop in his back.  He had pain immediately.  He remarks that he told a Merchandiser, retail.  He was told to go to the hospital if he got worse.  He is here in a wheelchair.  Complaining of severe pain.  States that he can hardly walk.  His girlfriend drove him to the visit.  States the pain is in his central low back.  Hurts with movement.  Hurts with standing and walking.  He walks with a slightly flexed position taking very small steps.  No numbness or weakness into the legs.  No bowel or bladder complaints.  Past Medical History:  Diagnosis Date  . Pneumonia     Patient Active Problem List   Diagnosis Date Noted  . Pneumonia     History reviewed. No pertinent surgical history.     Home Medications    Prior to Admission medications   Medication Sig Start Date End Date Taking? Authorizing Provider  acetaminophen (TYLENOL) 325 MG tablet Take 2 tablets (650 mg total) by mouth every 6 (six) hours as needed. 10/25/15   Sam, Ace Gins, PA-C  Aspirin-Salicylamide-Caffeine (BC HEADACHE POWDER PO) Take 1 packet by mouth daily as needed (pain).    [provider]  HYDROcodone-acetaminophen (NORCO) 7.5-325 MG tablet Take 1 tablet by mouth every 6 (six) hours as needed for moderate pain. 11/27/19   Eustace Moore, MD  ibuprofen (ADVIL) 800 MG tablet Take 1 tablet (800 mg total) by mouth 3 (three) times daily. 11/27/19   Eustace Moore, MD  lidocaine (LIDODERM) 5 % Place 1 patch onto the skin daily. Remove & Discard patch within 12 hours or as directed by MD 03/08/18   Maxwell Caul, PA-C    tiZANidine (ZANAFLEX) 4 MG tablet Take 1-2 tablets (4-8 mg total) by mouth every 6 (six) hours as needed for muscle spasms. 11/27/19   Eustace Moore, MD    Family History History reviewed. No pertinent family history.  Social History Social History   Tobacco Use  . Smoking status: Current Every Day Smoker    Packs/day: 1.00    Types: Cigarettes  . Smokeless tobacco: Never Used  Substance Use Topics  . Alcohol use: Yes  . Drug use: No     Allergies   Patient has no known allergies.   Review of Systems Review of Systems See HPI  Physical Exam Triage Vital Signs ED Triage Vitals  Enc Vitals Group     BP 11/27/19 1747 125/84     Pulse Rate 11/27/19 1747 70     Resp 11/27/19 1747 18     Temp 11/27/19 1747 98.2 F (36.8 C)     Temp Source 11/27/19 1747 Oral     SpO2 11/27/19 1747 100 %     Weight --      Height --      Head Circumference --      Peak Flow --      Pain Score 11/27/19 1746 10  Pain Loc --      Pain Edu? --      Excl. in GC? --    No data found.  Updated Vital Signs BP 125/84 (BP Location: Left Arm)   Pulse 70   Temp 98.2 F (36.8 C) (Oral)   Resp 18   SpO2 100%     Physical Exam Constitutional:      General: He is in acute distress.     Appearance: He is well-developed and normal weight.     Comments: Patient very demonstrative.  Moaning.  Leaning over.  Poor eye contact  HENT:     Head: Normocephalic and atraumatic.     Mouth/Throat:     Comments: Mask in place Eyes:     Conjunctiva/sclera: Conjunctivae normal.     Pupils: Pupils are equal, round, and reactive to light.  Cardiovascular:     Rate and Rhythm: Normal rate.  Pulmonary:     Effort: Pulmonary effort is normal. No respiratory distress.  Abdominal:     General: There is no distension.     Palpations: Abdomen is soft.  Musculoskeletal:        General: Normal range of motion.     Cervical back: Normal range of motion.     Comments: Patient grimaces and pulls  away from palpation of the lumbar spine from L2-L5 S1 with similar pain all across the posterior pelvis.  Mild tenderness to palpation of the lumbar muscles.  Patient gets out of the wheelchair and ask as if his legs are weak and he has to hold onto furniture.  No range of motion testing was accomplished.  Straight leg raise bilaterally caused vocalization and increased pain in the low back centrally with no radicular symptoms.  Reflexes normal.  Sensory exam is normal.  Skin:    General: Skin is warm and dry.  Neurological:     Mental Status: He is alert.     Gait: Gait abnormal.      UC Treatments / Results  Labs (all labs ordered are listed, but only abnormal results are displayed) Labs Reviewed - No data to display  EKG   Radiology DG Lumbar Spine Complete  Result Date: 11/27/2019 CLINICAL DATA:  Low back pain EXAM: LUMBAR SPINE - COMPLETE 4+ VIEW COMPARISON:  03/08/2018 FINDINGS: There is no evidence of lumbar spine fracture. Alignment is normal. Intervertebral disc spaces are maintained. IMPRESSION: Negative. Electronically Signed   By: Jasmine Pang M.D.   On: 11/27/2019 19:41    Procedures Procedures (including critical care time)  Medications Ordered in UC Medications  morphine 4 MG/ML injection 2 mg (2 mg Intramuscular Given 11/27/19 1852)    Initial Impression / Assessment and Plan / UC Course  I have reviewed the triage vital signs and the nursing notes.  Pertinent labs & imaging results that were available during my care of the patient were reviewed by me and considered in my medical decision making (see chart for details).     Back x-ray is normal.  I believe he has mechanical back pain.  We will treat him with anti-inflammatories, muscle relaxers, and pain medication.  Off his feet for couple days.  He can follow-up with occupational medicine or sports medicine. Final Clinical Impressions(s) / UC Diagnoses   Final diagnoses:  Acute midline low back pain  without sciatica     Discharge Instructions     Take ibuprofen 3 times a day with food Take hydrocodone if needed in addition.  This  is for severe pain Do not drive on the hydrocodone I have prescribed tizanidine as a muscle relaxer This is useful at bedtime Ice or heat to painful muscles Follow-up with occupational medicine later this week    ED Prescriptions    Medication Sig Dispense Auth. Provider   HYDROcodone-acetaminophen (NORCO) 7.5-325 MG tablet Take 1 tablet by mouth every 6 (six) hours as needed for moderate pain. 15 tablet Eustace Moore, MD   tiZANidine (ZANAFLEX) 4 MG tablet Take 1-2 tablets (4-8 mg total) by mouth every 6 (six) hours as needed for muscle spasms. 21 tablet Eustace Moore, MD   ibuprofen (ADVIL) 800 MG tablet Take 1 tablet (800 mg total) by mouth 3 (three) times daily. 21 tablet Eustace Moore, MD     I have reviewed the PDMP during this encounter.   Eustace Moore, MD 11/27/19 2005

## 2019-11-27 NOTE — ED Notes (Signed)
Spoke to dr Delton See and spoke to [employer], pharmacist in main pharmacy about product in pyxis and order.  Selena Batten, pharmacist agreed drug may be given im.    Dr Delton See aware of product

## 2019-11-27 NOTE — ED Triage Notes (Signed)
Pt presents with lower back pain x 1 day. States he was lifting a  box (50 pounds approx) at work and he hear a pop in his back. Denies numbness or tingling in legs.

## 2019-11-27 NOTE — Discharge Instructions (Signed)
Take ibuprofen 3 times a day with food Take hydrocodone if needed in addition.  This is for severe pain Do not drive on the hydrocodone I have prescribed tizanidine as a muscle relaxer This is useful at bedtime Ice or heat to painful muscles Follow-up with occupational medicine later this week

## 2019-12-06 ENCOUNTER — Other Ambulatory Visit: Payer: Self-pay

## 2019-12-06 ENCOUNTER — Encounter (HOSPITAL_COMMUNITY): Payer: Self-pay

## 2019-12-06 ENCOUNTER — Emergency Department (HOSPITAL_COMMUNITY)
Admission: EM | Admit: 2019-12-06 | Discharge: 2019-12-06 | Disposition: A | Discharge status: Home / Self Care | Payer: Self-pay | Attending: Emergency Medicine | Admitting: Emergency Medicine

## 2019-12-06 DIAGNOSIS — F17210 Nicotine dependence, cigarettes, uncomplicated: Secondary | ICD-10-CM | POA: Insufficient documentation

## 2019-12-06 DIAGNOSIS — M5442 Lumbago with sciatica, left side: Secondary | ICD-10-CM | POA: Insufficient documentation

## 2019-12-06 DIAGNOSIS — Z7982 Long term (current) use of aspirin: Secondary | ICD-10-CM | POA: Insufficient documentation

## 2019-12-06 DIAGNOSIS — Z79899 Other long term (current) drug therapy: Secondary | ICD-10-CM | POA: Insufficient documentation

## 2019-12-06 MED ORDER — LIDOCAINE 5 % EX PTCH: 1.0000 | MEDICATED_PATCH | CUTANEOUS | 0 refills | Status: AC

## 2019-12-06 MED ORDER — LIDOCAINE 5 % EX PTCH: 1.0000 | MEDICATED_PATCH | CUTANEOUS | 0 refills | Status: DC

## 2019-12-06 MED ORDER — DEXAMETHASONE SODIUM PHOSPHATE 10 MG/ML IJ SOLN
10.0000 mg | Freq: Once | INTRAMUSCULAR | Status: AC
  Administered 2019-12-06: 10 mg via INTRAVENOUS
  Filled 2019-12-06: qty 1

## 2019-12-06 MED ORDER — ONDANSETRON HCL 4 MG/2ML IJ SOLN
4.0000 mg | Freq: Once | INTRAMUSCULAR | Status: AC
  Administered 2019-12-06: 4 mg via INTRAVENOUS
  Filled 2019-12-06: qty 2

## 2019-12-06 MED ORDER — KETOROLAC TROMETHAMINE 15 MG/ML IJ SOLN
15.0000 mg | Freq: Once | INTRAMUSCULAR | Status: AC
  Administered 2019-12-06: 15 mg via INTRAVENOUS
  Filled 2019-12-06: qty 1

## 2019-12-06 MED ORDER — HYDROMORPHONE HCL 1 MG/ML IJ SOLN
0.5000 mg | Freq: Once | INTRAMUSCULAR | Status: AC
  Administered 2019-12-06: 0.5 mg via INTRAVENOUS
  Filled 2019-12-06: qty 1

## 2019-12-06 MED ORDER — PREDNISONE 20 MG PO TABS: ORAL_TABLET | ORAL | 0 refills | Status: AC

## 2019-12-06 MED ORDER — HYDROCODONE-ACETAMINOPHEN 5-325 MG PO TABS: 1.0000 | ORAL_TABLET | Freq: Four times a day (QID) | ORAL | 0 refills | Status: AC | PRN

## 2019-12-06 NOTE — Discharge Instructions (Signed)
Please read and follow all provided instructions.  Your diagnoses today include:  1. Acute bilateral low back pain with left-sided sciatica     Tests performed today include:  Vital signs - see below for your results today  Medications prescribed:   Vicodin (hydrocodone/acetaminophen) - narcotic pain medication  DO NOT drive or perform any activities that require you to be awake and alert because this medicine can make you drowsy. BE VERY CAREFUL not to take multiple medicines containing Tylenol (also called acetaminophen). Doing so can lead to an overdose which can damage your liver and cause liver failure and possibly death.   Prednisone - steroid medicine   It is best to take this medication in the morning to prevent sleeping problems. If you are diabetic, monitor your blood sugar closely and stop taking Prednisone if blood sugar is over 300. Take with food to prevent stomach upset.   Take any prescribed medications only as directed.  Home care instructions:   Follow any educational materials contained in this packet  Please rest, use ice or heat on your back for the next several days  Do not lift, push, pull anything more than 10 pounds for the next week  Follow-up instructions:  Please call the number listed to schedule an appointment with occupational medicine.  Return instructions:  SEEK IMMEDIATE MEDICAL ATTENTION IF YOU HAVE:  New numbness, tingling, weakness, or problem with the use of your arms or legs  Severe back pain not relieved with medications  Loss control of your bowels or bladder  Increasing pain in any areas of the body (such as chest or abdominal pain)  Shortness of breath, dizziness, or fainting.   Worsening nausea (feeling sick to your stomach), vomiting, fever, or sweats  Any other emergent concerns regarding your health   Additional Information:  Your vital signs today were: BP 117/83    Pulse 73    Temp 98.4 F (36.9 C) (Oral)    Resp  15    Ht 5\' 5"  (1.651 m)    Wt 63.5 kg    SpO2 100%    BMI 23.30 kg/m  If your blood pressure (BP) was elevated above 135/85 this visit, please have this repeated by your doctor within one month. --------------

## 2019-12-06 NOTE — ED Triage Notes (Signed)
Patient complains of ongoing lower back pain x 1 week. States that he has been taking tylenol and ibuprofen with no relief

## 2019-12-06 NOTE — ED Provider Notes (Signed)
[facility] EMERGENCY DEPARTMENT Provider Note   CSN: [id number] Arrival date & time: 12/06/19  6381     History No chief complaint on file.   Marcus Dennis is a 39 y.o. male.  Patient with no significant past medical history presents the emergency department with ongoing lower back pain with radiation into his bilateral legs.  Patient had acute onset of lower back pain on 11/26/2019 after lifting a heavy trailer tire.  Patient states that he felt a pop in his back during this incident.  The following day he went to urgent care and had a negative lumbar spine film.  Patient was prescribed hydrocodone, tizanidine, NSAIDs.  He states that he has been taking these without improvement.  He has been resting at home.  He states that he is able to get up and use the toilet, however this causes significant pain.  He is able to walk around his house slowly with pain.  Pain continues to be severe and worse with movement.  It radiates down into his bilateral legs, left greater than right.  Patient denies warning symptoms of back pain including: fecal incontinence, urinary retention or overflow incontinence, night sweats, waking from sleep with back pain, unexplained fevers or weight loss, h/o cancer, IVDU, recent trauma.           Past Medical History:  Diagnosis Date  . Pneumonia     Patient Active Problem List   Diagnosis Date Noted  . Pneumonia     History reviewed. No pertinent surgical history.     No family history on file.  Social History   Tobacco Use  . Smoking status: Current Every Day Smoker    Packs/day: 1.00    Types: Cigarettes  . Smokeless tobacco: Never Used  Substance Use Topics  . Alcohol use: Yes  . Drug use: No    Home Medications Prior to Admission medications   Medication Sig Start Date End Date Taking? Authorizing Provider  acetaminophen (TYLENOL) 325 MG tablet Take 2 tablets (650 mg total) by mouth every 6 (six) hours as needed.  10/25/15   Sam, Ace Gins, PA-C  Aspirin-Salicylamide-Caffeine (BC HEADACHE POWDER PO) Take 1 packet by mouth daily as needed (pain).    [provider]  HYDROcodone-acetaminophen (NORCO) 7.5-325 MG tablet Take 1 tablet by mouth every 6 (six) hours as needed for moderate pain. 11/27/19   Eustace Moore, MD  ibuprofen (ADVIL) 800 MG tablet Take 1 tablet (800 mg total) by mouth 3 (three) times daily. 11/27/19   Eustace Moore, MD  lidocaine (LIDODERM) 5 % Place 1 patch onto the skin daily. Remove & Discard patch within 12 hours or as directed by MD 03/08/18   Maxwell Caul, PA-C  tiZANidine (ZANAFLEX) 4 MG tablet Take 1-2 tablets (4-8 mg total) by mouth every 6 (six) hours as needed for muscle spasms. 11/27/19   Eustace Moore, MD    Allergies    Patient has no known allergies.  Review of Systems   Review of Systems  Constitutional: Negative for fever and unexpected weight change.  Gastrointestinal: Negative for constipation.       Neg for fecal incontinence  Genitourinary: Negative for difficulty urinating, flank pain and hematuria.       Negative for urinary incontinence or retention  Musculoskeletal: Positive for back pain.  Neurological: Negative for weakness and numbness.       Negative for saddle paresthesias     Physical Exam Updated Vital Signs  BP 117/83   Pulse 73   Temp 98.4 F (36.9 C) (Oral)   Resp 15   Ht 5\' 5"  (1.651 m)   Wt 63.5 kg   SpO2 100%   BMI 23.30 kg/m   Physical Exam Vitals and nursing note reviewed.  Constitutional:      General: He is in acute distress.     Appearance: He is well-developed.     Comments: Patient appears uncomfortable, crying and moaning in bed.  HENT:     Head: Normocephalic and atraumatic.  Eyes:     Conjunctiva/sclera: Conjunctivae normal.  Cardiovascular:     Rate and Rhythm: Normal rate.  Abdominal:     Palpations: Abdomen is soft.     Tenderness: There is no abdominal tenderness. There is no guarding  or rebound.  Musculoskeletal:        General: No tenderness. Normal range of motion.     Cervical back: Normal range of motion.     Comments: No step-off noted with palpation of spine.  Patient wincing and crying while lowering the bed into a flat position.  He is able to roll onto his side with significant discomfort.  He has tenderness to palpation over the lower lumbar spine and upper sacrum.  He winces with palpation in these areas.  Skin:    General: Skin is warm and dry.  Neurological:     General: No focal deficit present.     Mental Status: He is alert.     Sensory: No sensory deficit.     Motor: No weakness or abnormal muscle tone.     Deep Tendon Reflexes: Reflexes are normal and symmetric.     Comments: 5/5 strength in entire lower extremities bilaterally. No sensation deficit.      ED Results / Procedures / Treatments   Labs (all labs ordered are listed, but only abnormal results are displayed) Labs Reviewed - No data to display  EKG None  Radiology No results found.  Procedures Procedures (including critical care time)  Medications Ordered in ED Medications  HYDROmorphone (DILAUDID) injection 0.5 mg (has no administration in time range)  ketorolac (TORADOL) 15 MG/ML injection 15 mg (has no administration in time range)  dexamethasone (DECADRON) injection 10 mg (has no administration in time range)  ondansetron (ZOFRAN) injection 4 mg (has no administration in time range)    ED Course  I have reviewed the triage vital signs and the nursing notes.  Pertinent labs & imaging results that were available during my care of the patient were reviewed by me and considered in my medical decision making (see chart for details).  Patient seen and examined.  Patient without red flags, however will need pain better controlled.  IV medications ordered.  Vital signs reviewed and are as follows: BP 117/83   Pulse 73   Temp 98.4 F (36.9 C) (Oral)   Resp 15   Ht 5\' 5"   (1.651 m)   Wt 63.5 kg   SpO2 100%   BMI 23.30 kg/m   12:39 PM Patient is feeling better. Looks more comfortable. Will d/c to home with occupational medicine referral.   Vital signs reviewed and are as follows: Vitals:   12/06/19 0907 12/06/19 1030  BP: 132/84 117/83  Pulse: 81 73  Resp: 16 15  Temp: 98.7 F (37.1 C) 98.4 F (36.9 C)  SpO2: 99% 100%   He may use muscle relaxer prescribed previously.  Also home with #8 Vicodin tablets, Lidoderm patch, tapered course  of prednisone.  No red flag s/s of low back pain. Patient was counseled on back pain precautions and told to do activity as tolerated but do not lift, push, or pull heavy objects more than 10 pounds for the next week.  Patient counseled to use ice or heat on back for no longer than 15 minutes every hour.   Patient counseled on use of narcotic pain medications. Counseled not to combine these medications with others containing tylenol. Urged not to drink alcohol, drive, or perform any other activities that requires focus while taking these medications. The patient verbalizes understanding and agrees with the plan.  Patient urged to follow-up with PCP if pain does not improve with treatment and rest or if pain becomes recurrent. Urged to return with worsening severe pain, loss of bowel or bladder control, trouble walking.   The patient verbalizes understanding and agrees with the plan.    MDM Rules/Calculators/A&P                          Patient with back pain, with radicular features. No neurological deficits. Patient is ambulatory. No warning symptoms of back pain including: fecal incontinence, urinary retention or overflow incontinence, night sweats, waking from sleep with back pain, unexplained fevers or weight loss, h/o cancer, IVDU, recent trauma. No concern for cauda equina, epidural abscess, or other serious cause of back pain. Conservative measures such as rest, ice/heat and pain medicine indicated with PCP follow-up  if no improvement with conservative management.   Final Clinical Impression(s) / ED Diagnoses Final diagnoses:  Acute bilateral low back pain with left-sided sciatica    Rx / DC Orders ED Discharge Orders         Ordered    HYDROcodone-acetaminophen (NORCO/VICODIN) 5-325 MG tablet  Every 6 hours PRN        12/06/19 1236    predniSONE (DELTASONE) 20 MG tablet        12/06/19 1236    lidocaine (LIDODERM) 5 %  Every 24 hours        12/06/19 1236           Renne Crigler, PA-C 12/06/19 1241    Jacalyn Lefevre, MD 12/06/19 1310

## 2021-06-18 ENCOUNTER — Other Ambulatory Visit: Payer: Self-pay

## 2021-06-18 ENCOUNTER — Emergency Department (HOSPITAL_COMMUNITY): Payer: Self-pay

## 2021-06-18 ENCOUNTER — Encounter (HOSPITAL_COMMUNITY): Payer: Self-pay

## 2021-06-18 ENCOUNTER — Emergency Department (HOSPITAL_COMMUNITY)
Admission: EM | Admit: 2021-06-18 | Discharge: 2021-06-19 | Disposition: A | Discharge status: Home / Self Care | Payer: Self-pay | Attending: Emergency Medicine | Admitting: Emergency Medicine

## 2021-06-18 DIAGNOSIS — J189 Pneumonia, unspecified organism: Secondary | ICD-10-CM | POA: Insufficient documentation

## 2021-06-18 DIAGNOSIS — Z7982 Long term (current) use of aspirin: Secondary | ICD-10-CM | POA: Insufficient documentation

## 2021-06-18 DIAGNOSIS — R7309 Other abnormal glucose: Secondary | ICD-10-CM | POA: Insufficient documentation

## 2021-06-18 DIAGNOSIS — Z716 Tobacco abuse counseling: Secondary | ICD-10-CM | POA: Insufficient documentation

## 2021-06-18 DIAGNOSIS — F17200 Nicotine dependence, unspecified, uncomplicated: Secondary | ICD-10-CM | POA: Insufficient documentation

## 2021-06-18 DIAGNOSIS — R079 Chest pain, unspecified: Secondary | ICD-10-CM

## 2021-06-18 DIAGNOSIS — R791 Abnormal coagulation profile: Secondary | ICD-10-CM | POA: Insufficient documentation

## 2021-06-18 DIAGNOSIS — Z7952 Long term (current) use of systemic steroids: Secondary | ICD-10-CM | POA: Insufficient documentation

## 2021-06-18 LAB — URINALYSIS, ROUTINE W REFLEX MICROSCOPIC
Bacteria, UA: NONE SEEN
Bilirubin Urine: NEGATIVE
Glucose, UA: NEGATIVE mg/dL
Ketones, ur: NEGATIVE mg/dL
Leukocytes,Ua: NEGATIVE
Nitrite: NEGATIVE
Protein, ur: NEGATIVE mg/dL
Specific Gravity, Urine: 1.016 (ref 1.005–1.030)
pH: 5 (ref 5.0–8.0)

## 2021-06-18 LAB — COMPREHENSIVE METABOLIC PANEL
ALT: 24 U/L (ref 0–44)
AST: 25 U/L (ref 15–41)
Albumin: 3.2 g/dL — ABNORMAL LOW (ref 3.5–5.0)
Alkaline Phosphatase: 81 U/L (ref 38–126)
Anion gap: 9 (ref 5–15)
BUN: 8 mg/dL (ref 6–20)
CO2: 26 mmol/L (ref 22–32)
Calcium: 9.1 mg/dL (ref 8.9–10.3)
Chloride: 101 mmol/L (ref 98–111)
Creatinine, Ser: 0.78 mg/dL (ref 0.61–1.24)
GFR, Estimated: >60 mL/min (ref >60)
Glucose, Bld: 114 mg/dL — ABNORMAL HIGH (ref 70–99)
Potassium: 3.5 mmol/L (ref 3.5–5.1)
Sodium: 136 mmol/L (ref 135–145)
Total Bilirubin: 0.5 mg/dL (ref 0.3–1.2)
Total Protein: 7.9 g/dL (ref 6.5–8.1)

## 2021-06-18 LAB — D-DIMER, QUANTITATIVE: D-Dimer, Quant: 3.36 ug/mL-FEU — ABNORMAL HIGH (ref 0.00–0.50)

## 2021-06-18 LAB — CBC
HCT: 37.7 % — ABNORMAL LOW (ref 39.0–52.0)
Hemoglobin: 13.9 g/dL (ref 13.0–17.0)
MCH: 33.3 pg (ref 26.0–34.0)
MCHC: 36.9 g/dL — ABNORMAL HIGH (ref 30.0–36.0)
MCV: 90.2 fL (ref 80.0–100.0)
Platelets: 249 10*3/uL (ref 150–400)
RBC: 4.18 MIL/uL — ABNORMAL LOW (ref 4.22–5.81)
RDW: 11.3 % — ABNORMAL LOW (ref 11.5–15.5)
WBC: 10 10*3/uL (ref 4.0–10.5)
nRBC: 0 % (ref 0.0–0.2)

## 2021-06-18 LAB — LIPASE, BLOOD: Lipase: 25 U/L (ref 11–51)

## 2021-06-18 MED ORDER — IBUPROFEN 800 MG PO TABS: 800.0000 mg | ORAL_TABLET | Freq: Three times a day (TID) | ORAL | 0 refills | Status: AC | PRN

## 2021-06-18 MED ORDER — KETOROLAC TROMETHAMINE 60 MG/2ML IM SOLN
60.0000 mg | Freq: Once | INTRAMUSCULAR | Status: AC
  Administered 2021-06-18: 60 mg via INTRAMUSCULAR
  Filled 2021-06-18: qty 2

## 2021-06-18 MED ORDER — AZITHROMYCIN 250 MG PO TABS: 250.0000 mg | ORAL_TABLET | Freq: Every day | ORAL | 0 refills | Status: AC

## 2021-06-18 MED ORDER — IOHEXOL 350 MG/ML SOLN
60.0000 mL | Freq: Once | INTRAVENOUS | Status: AC | PRN
Start: 2021-06-18 — End: 2021-06-18
  Administered 2021-06-18: 60 mL via INTRAVENOUS

## 2021-06-18 NOTE — Discharge Instructions (Signed)
You were seen in the emergency department for left-sided chest pain especially with coughing and deep breathing.  You had a CAT scan of your chest that showed possible left-sided pneumonia.  This is likely the cause of your pain.  We are putting you on antibiotics and anti-inflammatories.  Please follow-up with your primary care doctor.  Return to the emergency department if any worsening or concerning symptoms ?

## 2021-06-18 NOTE — ED Notes (Signed)
Daughter at the bedside

## 2021-06-18 NOTE — ED Provider Triage Note (Signed)
Emergency Medicine Provider Triage Evaluation Note ? ?Marcus Dennis , a 41 y.o. male  was evaluated in triage.  Pt complains of left-sided thoracic pain for 1 week.  Patient states pain is made worse with inspiration.  Took some Tylenol with minimal relief. ? ?Review of Systems  ?Positive: Left-sided thoracic pain, pleuritic pain ?Negative: Central chest pain, abdominal pain, nausea, vomiting, diarrhea, cough ? ?Physical Exam  ?BP (!) 128/93 (BP Location: Left Arm)   Pulse 98   Temp 99.3 ?F (37.4 ?C) (Oral)   Resp 15   Ht 5\' 6"  (1.676 m)   Wt 63.5 kg   SpO2 97%   BMI 22.60 kg/m?  ?Gen:   Awake, no distress   ?Resp:  Normal effort  ?MSK:   Moves extremities without difficulty  ?Other:   ? ?Medical Decision Making  ?Medically screening exam initiated at 5:53 PM.  Appropriate orders placed.  Marcus Dennis was informed that the remainder of the evaluation will be completed by another provider, this initial triage assessment does not replace that evaluation, and the importance of remaining in the ED until their evaluation is complete. ? ? ?  ? , PA-C ?06/18/21 1753 ? ?

## 2021-06-18 NOTE — ED Provider Notes (Signed)
?[facility] ?Provider Note ? ? ?CSN: [id number] ?Arrival date & time: 06/18/21  1706 ? ?  ? ?History ? ?Chief Complaint  ?Patient presents with  ? Side Pain  ? ? ?Marcus Dennis is a 41 y.o. male.  He has no significant past medical history.  Complaining of some left lower lateral chest and flank pain that started about a few weeks ago but worse since last night.  Worse with bending twisting coughing and deep breathing.  Has tried Tylenol without any improvement.  No cough or shortness of breath.  No fevers or chills.  No known trauma.  Admits to tobacco and marijuana use. ? ?The history is provided by the patient.  ?Chest Pain ?Pain location:  L lateral chest ?Pain quality: stabbing   ?Pain radiates to:  Mid back ?Pain severity:  Severe ?Onset quality:  Gradual ?Duration:  2 weeks ?Timing:  Intermittent ?Progression:  Worsening ?Chronicity:  New ?Context: not trauma   ?Relieved by:  Nothing ?Worsened by:  Deep breathing, coughing and movement ?Ineffective treatments: Tylenol. ?Associated symptoms: no abdominal pain, no cough, no diaphoresis, no fever, no nausea, no shortness of breath and no vomiting   ?Risk factors: smoking   ? ?  ? ?Home Medications ?Prior to Admission medications   ?Medication Sig Start Date End Date Taking? Authorizing Provider  ?Aspirin-Salicylamide-Caffeine (BC HEADACHE POWDER PO) Take 1 packet by mouth daily as needed (pain).    [provider]  ?HYDROcodone-acetaminophen (NORCO/VICODIN) 5-325 MG tablet Take 1 tablet by mouth every 6 (six) hours as needed for severe pain. 12/06/19   Renne CriglerGeiple, Joshua, PA-C  ?ibuprofen (ADVIL) 800 MG tablet Take 1 tablet (800 mg total) by mouth 3 (three) times daily. 11/27/19   Eustace MooreNelson, Yvonne Sue, MD  ?lidocaine (LIDODERM) 5 % Place 1 patch onto the skin daily. Remove & Discard patch within 12 hours or as directed by MD 12/06/19   Renne CriglerGeiple, Joshua, PA-C  ?predniSONE (DELTASONE) 20 MG tablet 3 Tabs PO Days 1-3, then 2 tabs  PO Days 4-6, then 1 tab PO Day 7-9, then Half Tab PO Day 10-12 12/06/19   Renne CriglerGeiple, Joshua, PA-C  ?tiZANidine (ZANAFLEX) 4 MG tablet Take 1-2 tablets (4-8 mg total) by mouth every 6 (six) hours as needed for muscle spasms. 11/27/19   Eustace MooreNelson, Yvonne Sue, MD  ?   ? ?Allergies    ?Patient has no known allergies.   ? ?Review of Systems   ?Review of Systems  ?Constitutional:  Negative for diaphoresis and fever.  ?HENT:  Negative for sore throat.   ?Respiratory:  Negative for cough and shortness of breath.   ?Cardiovascular:  Positive for chest pain.  ?Gastrointestinal:  Negative for abdominal pain, nausea and vomiting.  ?Genitourinary:  Negative for dysuria.  ?Musculoskeletal:  Negative for neck pain.  ?Skin:  Negative for rash.  ? ?Physical Exam ?Updated Vital Signs ?BP (!) 128/93 (BP Location: Left Arm)   Pulse 98   Temp 99.3 ?F (37.4 ?C) (Oral)   Resp 15   Ht 5\' 6"  (1.676 m)   Wt 63.5 kg   SpO2 97%   BMI 22.60 kg/m?  ?Physical Exam ?Vitals and nursing note reviewed.  ?Constitutional:   ?   General: He is not in acute distress. ?   Appearance: Normal appearance. He is well-developed.  ?HENT:  ?   Head: Normocephalic and atraumatic.  ?Eyes:  ?   Conjunctiva/sclera: Conjunctivae normal.  ?Cardiovascular:  ?   Rate and Rhythm: Normal rate  and regular rhythm.  ?   Heart sounds: No murmur heard. ?Pulmonary:  ?   Effort: Pulmonary effort is normal. No respiratory distress.  ?   Breath sounds: Normal breath sounds.  ?Chest:  ?   Comments: He has left lower lateral chest pain and left posterior rib pain.  No crepitus.  No overlying skin changes. ?Abdominal:  ?   Palpations: Abdomen is soft.  ?   Tenderness: There is no abdominal tenderness. There is no guarding or rebound.  ?Musculoskeletal:     ?   General: No swelling.  ?   Cervical back: Neck supple.  ?Skin: ?   General: Skin is warm and dry.  ?   Capillary Refill: Capillary refill takes less than 2 seconds.  ?Neurological:  ?   Mental Status: He is alert.  ?Psychiatric:      ?   Mood and Affect: Mood normal.  ? ? ?ED Results / Procedures / Treatments   ?Labs ?(all labs ordered are listed, but only abnormal results are displayed) ?Labs Reviewed  ?COMPREHENSIVE METABOLIC PANEL - Abnormal; Notable for the following components:  ?    Result Value  ? Glucose, Bld 114 (*)   ? Albumin 3.2 (*)   ? All other components within normal limits  ?CBC - Abnormal; Notable for the following components:  ? RBC 4.18 (*)   ? HCT 37.7 (*)   ? MCHC 36.9 (*)   ? RDW 11.3 (*)   ? All other components within normal limits  ?URINALYSIS, ROUTINE W REFLEX MICROSCOPIC - Abnormal; Notable for the following components:  ? Hgb urine dipstick SMALL (*)   ? All other components within normal limits  ?D-DIMER, QUANTITATIVE - Abnormal; Notable for the following components:  ? D-Dimer, Quant 3.36 (*)   ? All other components within normal limits  ?LIPASE, BLOOD  ? ? ?EKG ?None ? ?Radiology ?DG Chest 2 View ? ?Result Date: 06/18/2021 ?CLINICAL DATA:  Left-sided chest wall pain for 1 week. EXAM: CHEST - 2 VIEW COMPARISON:  Chest radiograph 12/26/2013 FINDINGS: The heart size and mediastinal contours are within normal limits. Minimal subsegmental atelectasis at the right lung base. The lungs are otherwise clear. No pleural effusion or pneumothorax. No acute osseous abnormality. IMPRESSION: No acute cardiopulmonary abnormality. Electronically Signed   By: Sherron Ales M.D.   On: 06/18/2021 18:53  ? ?CT Angio Chest PE W/Cm &/Or Wo Cm ? ?Result Date: 06/18/2021 ?CLINICAL DATA:  Left chest pain, positive D-dimer EXAM: CT ANGIOGRAPHY CHEST WITH CONTRAST TECHNIQUE: Multidetector CT imaging of the chest was performed using the standard protocol during bolus administration of intravenous contrast. Multiplanar CT image reconstructions and MIPs were obtained to evaluate the vascular anatomy. RADIATION DOSE REDUCTION: This exam was performed according to the departmental dose-optimization program which includes automated exposure  control, adjustment of the mA and/or kV according to patient size and/or use of iterative reconstruction technique. CONTRAST:  62mL OMNIPAQUE IOHEXOL 350 MG/ML SOLN COMPARISON:  Chest radiograph dated 06/18/2021 FINDINGS: Cardiovascular: Satisfactory opacification of the bilateral pulmonary arteries to the segmental level. No evidence of pulmonary embolism. Study is not tailored for evaluation of the thoracic aorta. No evidence of thoracic aortic aneurysm. The heart is normal in size.  No pericardial effusion. Mediastinum/Nodes: No suspicious mediastinal lymphadenopathy. Visualized thyroid is unremarkable. Lungs/Pleura: Mild bilateral lower lobe atelectasis. No focal consolidation. Faint ground-glass nodularity in the left lung, measuring up to 5 mm (series 8/image 44), suggesting mild infection/pneumonia. Platelike scarring/atelectasis in the right middle  lobe. Trace left pleural effusion. No pneumothorax. Upper Abdomen: Visualized upper abdomen is grossly unremarkable. Musculoskeletal: Visualized osseous structures are within normal limits. Review of the MIP images confirms the above findings. IMPRESSION: No evidence of pulmonary embolism. Faint nodularity in the left lung, measuring up to 5 mm, favoring mild infection/pneumonia. No follow-up needed if patient is low-risk. This recommendation follows the consensus statement: Guidelines for Management of Incidental Pulmonary Nodules Detected on CT Images: From the Fleischner Society 2017; Radiology 2017; 284:228-243. Electronically Signed   By: Charline Bills M.D.   On: 06/18/2021 23:30   ? ?Procedures ?Procedures  ? ? ?Medications Ordered in ED ?Medications  ?ketorolac (TORADOL) injection 60 mg (60 mg Intramuscular Given 06/18/21 2000)  ?iohexol (OMNIPAQUE) 350 MG/ML injection 60 mL (60 mLs Intravenous Contrast Given 06/18/21 2322)  ? ? ?ED Course/ Medical Decision Making/ A&P ?  ?                        ?Medical Decision Making ?Amount and/or Complexity of Data  Reviewed ?Labs: ordered. ?Radiology: ordered. ? ?Risk ?Prescription drug management. ? ?This patient complains of left lateral chest and flank pain; this involves an extensive number of treatment ?Options and is

## 2021-06-18 NOTE — ED Triage Notes (Addendum)
Pt reports left sided thoracic pain worse with inspiration onset a week ago. Denies injury. ?

## 2023-02-16 ENCOUNTER — Emergency Department (HOSPITAL_COMMUNITY)
Admission: EM | Admit: 2023-02-16 | Discharge: 2023-02-16 | Disposition: A | Discharge status: Home / Self Care | Payer: Worker's Compensation | Attending: Emergency Medicine | Admitting: Emergency Medicine

## 2023-02-16 ENCOUNTER — Other Ambulatory Visit: Payer: Self-pay

## 2023-02-16 ENCOUNTER — Encounter (HOSPITAL_COMMUNITY): Payer: Self-pay

## 2023-02-16 DIAGNOSIS — W228XXA Striking against or struck by other objects, initial encounter: Secondary | ICD-10-CM | POA: Insufficient documentation

## 2023-02-16 DIAGNOSIS — F17210 Nicotine dependence, cigarettes, uncomplicated: Secondary | ICD-10-CM | POA: Insufficient documentation

## 2023-02-16 DIAGNOSIS — Y990 Civilian activity done for income or pay: Secondary | ICD-10-CM | POA: Insufficient documentation

## 2023-02-16 DIAGNOSIS — S0501XA Injury of conjunctiva and corneal abrasion without foreign body, right eye, initial encounter: Secondary | ICD-10-CM | POA: Insufficient documentation

## 2023-02-16 DIAGNOSIS — S0591XA Unspecified injury of right eye and orbit, initial encounter: Secondary | ICD-10-CM | POA: Diagnosis present

## 2023-02-16 MED ORDER — TETRACAINE HCL 0.5 % OP SOLN
1.0000 [drp] | Freq: Once | OPHTHALMIC | Status: AC
  Administered 2023-02-16: 1 [drp] via OPHTHALMIC
  Filled 2023-02-16: qty 4

## 2023-02-16 MED ORDER — FLUORESCEIN SODIUM 1 MG OP STRP
1.0000 | ORAL_STRIP | Freq: Once | OPHTHALMIC | Status: AC
  Administered 2023-02-16: 1 via OPHTHALMIC
  Filled 2023-02-16: qty 1

## 2023-02-16 MED ORDER — ERYTHROMYCIN 5 MG/GM OP OINT: TOPICAL_OINTMENT | OPHTHALMIC | 0 refills | Status: AC

## 2023-02-16 NOTE — ED Provider Notes (Signed)
EMERGENCY DEPARTMENT AT [facility] Provider Note  CSN: [id number] Arrival date & time: 02/16/23 1206  Chief Complaint(s) Eye Injury  HPI Marcus Dennis is a 42 y.o. male without significant past history presenting to emergency department with eye pain.  He reports that yesterday was hit in the right eye with a piece of cardboard at his work.  Has had eye pain, blurry vision, redness.  Does not wear contact lenses.   Past Medical History Past Medical History:  Diagnosis Date   Pneumonia    Patient Active Problem List   Diagnosis Date Noted   Pneumonia    Home Medication(s) Prior to Admission medications   Medication Sig Start Date End Date Taking? Authorizing Provider  erythromycin ophthalmic ointment Place a 1/2 inch ribbon of ointment into the lower eyelid. 02/16/23  Yes Lonell Grandchild, MD  azithromycin (ZITHROMAX) 250 MG tablet Take 1 tablet (250 mg total) by mouth daily. Take first 2 tablets together, then 1 every day until finished. 06/18/21   Terrilee Files, MD  HYDROcodone-acetaminophen (NORCO/VICODIN) 5-325 MG tablet Take 1 tablet by mouth every 6 (six) hours as needed for severe pain. Patient not taking: Reported on 06/18/2021 12/06/19   Renne Crigler, PA-C  ibuprofen (ADVIL) 800 MG tablet Take 1 tablet (800 mg total) by mouth every 8 (eight) hours as needed. 06/18/21   Terrilee Files, MD  lidocaine (LIDODERM) 5 % Place 1 patch onto the skin daily. Remove & Discard patch within 12 hours or as directed by MD Patient not taking: Reported on 06/18/2021 12/06/19   Renne Crigler, PA-C  predniSONE (DELTASONE) 20 MG tablet 3 Tabs PO Days 1-3, then 2 tabs PO Days 4-6, then 1 tab PO Day 7-9, then Half Tab PO Day 10-12 Patient not taking: Reported on 06/18/2021 12/06/19   Renne Crigler, PA-C  tiZANidine (ZANAFLEX) 4 MG tablet Take 1-2 tablets (4-8 mg total) by mouth every 6 (six) hours as needed for muscle spasms. Patient not taking: Reported on 06/18/2021  11/27/19   Eustace Moore, MD                                                                                                                                    Past Surgical History History reviewed. No pertinent surgical history. Family History History reviewed. No pertinent family history.  Social History Social History   Tobacco Use   Smoking status: Every Day    Current packs/day: 1.00    Types: Cigarettes   Smokeless tobacco: Never  Substance Use Topics   Alcohol use: Yes   Drug use: No   Allergies Patient has no known allergies.  Review of Systems Review of Systems  All other systems reviewed and are negative.   Physical Exam Vital Signs  I have reviewed the triage vital signs BP (!) 152/103   Pulse 69   Temp 98.7 F (37.1 C)  Resp 18   Ht 5\' 6"  (1.676 m)   SpO2 98%   BMI 22.60 kg/m  Physical Exam Vitals and nursing note reviewed.  Constitutional:      General: He is not in acute distress.    Appearance: Normal appearance.  HENT:     Head: Normocephalic and atraumatic.     Mouth/Throat:     Mouth: Mucous membranes are moist.  Eyes:     Comments: Right eye conjunctival injection with large corneal abrasion over visual axis.  Lids swept and everted with no foreign body.  Negative Seidel sign  Cardiovascular:     Rate and Rhythm: Normal rate.  Pulmonary:     Effort: Pulmonary effort is normal. No respiratory distress.  Abdominal:     General: Abdomen is flat.  Skin:    General: Skin is warm and dry.     Capillary Refill: Capillary refill takes less than 2 seconds.  Neurological:     General: No focal deficit present.     Mental Status: He is alert. Mental status is at baseline.  Psychiatric:        Mood and Affect: Mood normal.        Behavior: Behavior normal.     ED Results and Treatments Labs (all labs ordered are listed, but only abnormal results are displayed) Labs Reviewed - No data to display                                                                                                                         Radiology No results found.  Pertinent labs & imaging results that were available during my care of the patient were reviewed by me and considered in my medical decision making (see MDM for details).  Medications Ordered in ED Medications  fluorescein ophthalmic strip 1 strip (1 strip Both Eyes Given by Other 02/16/23 2046)  tetracaine (PONTOCAINE) 0.5 % ophthalmic solution 1-2 drop (1 drop Both Eyes Given by Other 02/16/23 2045)                                                                                                                                     Procedures Procedures  (including critical care time)  Medical Decision Making / ED Course   MDM:  42 year old with right eye pain after being hit in the eye with a piece of cardboard.  On exam, patient has  large corneal abrasion.  No evidence of open globe.  Pain entirely resolved s/p tetracaine. Does not wear contact lenses.  Will prescribe erythromycin ointment.  His visual acuity is slightly diminished but this is consistent with corneal abrasion over his visual axis.  Advise close follow-up with ophthalmology and strict ER precautions.  Lids swept with no evidence of residual foreign body.      Medicines ordered and prescription drug management: Meds ordered this encounter  Medications   fluorescein ophthalmic strip 1 strip   tetracaine (PONTOCAINE) 0.5 % ophthalmic solution 1-2 drop   erythromycin ophthalmic ointment    Sig: Place a 1/2 inch ribbon of ointment into the lower eyelid.    Dispense:  3.5 g    Refill:  0    -I have reviewed the patients home medicines and have made adjustments as needed   Reevaluation: After the interventions noted above, I reevaluated the patient and found that their symptoms have improved  Co morbidities that complicate the patient evaluation  Past Medical History:  Diagnosis Date   Pneumonia        Dispostion: Disposition decision including need for hospitalization was considered, and patient discharged from emergency department.    Final Clinical Impression(s) / ED Diagnoses Final diagnoses:  Abrasion of right cornea, initial encounter     This chart was dictated using voice recognition software.  Despite best efforts to proofread,  errors can occur which can change the documentation meaning.    Lonell Grandchild, MD 02/16/23 2101

## 2023-02-16 NOTE — ED Notes (Signed)
Pt in NAD at d/c from ED. A&O. Ambulatory. Respirations even & unlabored. Skin warm & dry. Pt verbalized understanding of d/c teaching including follow up care, medications and reasons to return to the ED. No needs or questions expressed at d/c.

## 2023-02-16 NOTE — Discharge Instructions (Addendum)
We evaluated you for your eye pain and redness.  You have a scrape on your eye called a corneal abrasion.  We have prescribed you some antibiotic ointment to place in your eyelid to help treat this.  Please use this 4 times daily.  Please call Dr. Zenaida Niece to schedule a follow-up appointment to make sure your eye is healing appropriately.  Please return if you develop any new or worsening symptoms such as worsening pain, visual changes, or any other new symptoms.

## 2023-02-16 NOTE — ED Provider Triage Note (Signed)
Emergency Medicine Provider Triage Evaluation Note  Marcus Dennis , a 42 y.o. male  was evaluated in triage.  Pt complains of right eye pain, irritation, after being struck in the eye with cardboard yesterday. Reports unable to open eye without watering, decreased vision throughout.  Review of Systems  Positive: Eye pain, irritation, vision changes Negative:   Physical Exam  BP (!) 129/97 (BP Location: Right Arm)   Pulse 77   Temp 98.5 F (36.9 C)   Resp 16   Ht 5\' 6"  (1.676 m)   SpO2 100%   BMI 22.60 kg/m  Gen:   Awake, no distress   Resp:  Normal effort  MSK:   Moves extremities without difficulty  Other:  Red irritated and injected conjunctivae  Medical Decision Making  Medically screening exam initiated at 1:40 PM.  Appropriate orders placed.  Ranald Sigel was informed that the remainder of the evaluation will be completed by another provider, this initial triage assessment does not replace that evaluation, and the importance of remaining in the ED until their evaluation is complete.  Workup initiated in triage    [place] 02/16/23 1341

## 2023-02-16 NOTE — ED Triage Notes (Signed)
Patient comes in after cardboard hit his eye at work last night. Today his right eye is swollen and leaking tears. He reports no bleeding but blurry vision when he opens it and itchy feeling inside the eye lid.
